# Patient Record
Sex: Female | Born: 1985
Health system: Southern US, Community
[De-identification: ages and names within clinical notes are randomized; demographics above are authoritative.]

## PROBLEM LIST (undated history)

## (undated) DIAGNOSIS — R011 Cardiac murmur, unspecified: Secondary | ICD-10-CM

## (undated) DIAGNOSIS — B019 Varicella without complication: Secondary | ICD-10-CM

## (undated) DIAGNOSIS — G43909 Migraine, unspecified, not intractable, without status migrainosus: Secondary | ICD-10-CM

## (undated) DIAGNOSIS — I10 Essential (primary) hypertension: Secondary | ICD-10-CM

## (undated) DIAGNOSIS — E282 Polycystic ovarian syndrome: Secondary | ICD-10-CM

## (undated) DIAGNOSIS — K219 Gastro-esophageal reflux disease without esophagitis: Secondary | ICD-10-CM

## (undated) DIAGNOSIS — Z87440 Personal history of urinary (tract) infections: Secondary | ICD-10-CM

## (undated) HISTORY — DX: Polycystic ovarian syndrome: E28.2

## (undated) HISTORY — DX: Migraine, unspecified, not intractable, without status migrainosus: G43.909

## (undated) HISTORY — DX: Varicella without complication: B01.9

## (undated) HISTORY — DX: Gastro-esophageal reflux disease without esophagitis: K21.9

## (undated) HISTORY — DX: Personal history of urinary (tract) infections: Z87.440

## (undated) HISTORY — PX: WISDOM TOOTH EXTRACTION: SHX21

## (undated) HISTORY — DX: Cardiac murmur, unspecified: R01.1

---

## 2012-05-27 HISTORY — PX: CHOLECYSTECTOMY OPEN: SUR202

## 2014-11-10 ENCOUNTER — Encounter (INDEPENDENT_AMBULATORY_CARE_PROVIDER_SITE_OTHER): Payer: Self-pay

## 2014-11-10 ENCOUNTER — Ambulatory Visit (INDEPENDENT_AMBULATORY_CARE_PROVIDER_SITE_OTHER): Payer: 59 | Admitting: Primary Care

## 2014-11-10 ENCOUNTER — Encounter: Payer: Self-pay | Admitting: Primary Care

## 2014-11-10 VITALS — BP 130/82 | HR 74 | Temp 98.2°F | Ht 62.0 in | Wt 214.4 lb

## 2014-11-10 DIAGNOSIS — G43909 Migraine, unspecified, not intractable, without status migrainosus: Secondary | ICD-10-CM | POA: Insufficient documentation

## 2014-11-10 DIAGNOSIS — R635 Abnormal weight gain: Secondary | ICD-10-CM | POA: Diagnosis not present

## 2014-11-10 DIAGNOSIS — G43001 Migraine without aura, not intractable, with status migrainosus: Secondary | ICD-10-CM | POA: Diagnosis not present

## 2014-11-10 DIAGNOSIS — E669 Obesity, unspecified: Secondary | ICD-10-CM

## 2014-11-10 LAB — BASIC METABOLIC PANEL
BUN: 8 mg/dL (ref 6–23)
CALCIUM: 9.5 mg/dL (ref 8.4–10.5)
CO2: 26 meq/L (ref 19–32)
CREATININE: 0.77 mg/dL (ref 0.40–1.20)
Chloride: 102 mEq/L (ref 96–112)
GFR: 113.5 mL/min (ref >60.00)
GLUCOSE: 87 mg/dL (ref 70–99)
Potassium: 3.7 mEq/L (ref 3.5–5.1)
Sodium: 135 mEq/L (ref 135–145)

## 2014-11-10 LAB — TSH: TSH: 2.19 u[IU]/mL (ref 0.35–4.50)

## 2014-11-10 NOTE — Assessment & Plan Note (Signed)
Intermittent since childhood. Recently returned over past 6 months. +photophobia and nausea. Relief with sleep and excedrin migraine. Not on preventative meds at this time. Will continue to monitor.

## 2014-11-10 NOTE — Progress Notes (Signed)
Pre visit review using our clinic review tool, if applicable. No additional management support is needed unless otherwise documented below in the visit note. 

## 2014-11-10 NOTE — Progress Notes (Signed)
Subjective:    Patient ID: Veronica Foster, female    DOB: 1985/05/29, 29 y.o.   MRN: [id number]  HPI  Veronica Foster is a 29 year old female who presents today to establish care and discuss the problems mentioned below. Will review old records.  1) Weight gain: Steady weight gain throughout the past 3 years with 35 pounds of weight gain over the past year. She reports daytime tiredness, irregular periods (since 20's), hair growth to face, intermittent palpitations every 2-3 days. Denies enlargement to her anterior neck or difficulty swallowing.  2) Migraines: Present as a child, once managed on medication. No migraines until about 6 months ago and will get them once weekly. She will experience photophobia and nausea. Her migraines will disspate if she's able to fall asleep. Occasionally takes Excedrin migraine with some relief.  Review of Systems  Constitutional:       Steady weight gain over 3 years.  HENT: Negative for rhinorrhea.   Respiratory: Negative for cough and shortness of breath.   Cardiovascular: Negative for chest pain.  Gastrointestinal: Negative for diarrhea and constipation.  Genitourinary: Negative for dysuria and frequency.  Musculoskeletal: Negative for myalgias and arthralgias.  Skin: Negative for rash.  Neurological: Positive for headaches. Negative for dizziness.  Psychiatric/Behavioral:       Denies concerns for anxiety or depression       Past Medical History  Diagnosis Date  . Chicken pox   . GERD (gastroesophageal reflux disease)   . Migraine   . History of UTI     History   Social History  . Marital Status: Single    Spouse Name: N/A  . Number of Children: N/A  . Years of Education: N/A   Occupational History  . Not on file.   Social History Main Topics  . Smoking status: Never Smoker   . Smokeless tobacco: Not on file  . Alcohol Use: 0.0 oz/week    0 Standard drinks or equivalent per week     Comment: soical  . Drug Use: Not on file  .  Sexual Activity: Not on file   Other Topics Concern  . Not on file   Social History Narrative   Single.   Highest level of education is Masters.   Works for [employer] in [employer].   Enjoys traveling, reading, wine tasting.    Past Surgical History  Procedure Laterality Date  . Cholecystectomy open  2014    Family History  Problem Relation Age of Onset  . Cancer Mother     colon  . Stroke Mother   . Hypertension Mother   . Arthritis Paternal Aunt   . Hypertension Maternal Grandmother   . Cancer Maternal Grandfather     prostate  . Hypertension Maternal Grandfather   . Arthritis Paternal Grandmother   . Hypertension Paternal Grandmother   . Cancer Paternal Grandfather     prostate  . Heart disease Paternal Grandfather   . Stroke Paternal Grandfather   . Hypertension Paternal Grandfather     No Known Allergies  No current outpatient prescriptions on file prior to visit.   No current facility-administered medications on file prior to visit.    BP 130/82 mmHg  Pulse 74  Temp(Src) 98.2 F (36.8 C) (Oral)  Ht  (1.575 m)  Wt 214 lb 6.4 oz (97.251 kg)  BMI 39.20 kg/m2  SpO2 96%    Objective:   Physical Exam  Constitutional: She is oriented to person, place, and  time. She appears well-nourished.  HENT:  Right Ear: Tympanic membrane and ear canal normal.  Left Ear: Tympanic membrane and ear canal normal.  Neck: Neck supple. No thyromegaly present.  Cardiovascular: Normal rate and regular rhythm.   Pulmonary/Chest: Effort normal and breath sounds normal.  Neurological: She is alert and oriented to person, place, and time.  Skin: Skin is warm and dry.  Psychiatric: She has a normal mood and affect.          Assessment & Plan:

## 2014-11-10 NOTE — Assessment & Plan Note (Signed)
Steady weight gain over the past 3 years. She endorses healthy lifestyle. Will check TSH and BMP for sugars. Could be PCOS due to irregular periods, hirsutism and weight gain. Will continue to follow.

## 2014-11-10 NOTE — Patient Instructions (Signed)
Complete lab work prior to leaving today. I will notify you of your results.  Please schedule a physical with me in the next 2-3 months. You will also schedule a lab only appointment one week prior. We will discuss your lab results during your physical.  It was a pleasure to meet you today! Please don't hesitate to call me with any questions. Welcome to [facility]!

## 2014-11-11 ENCOUNTER — Telehealth: Payer: Self-pay | Admitting: Primary Care

## 2014-11-11 NOTE — Telephone Encounter (Signed)
Patient returned Chan's call.  Please call her back at work.

## 2014-11-11 NOTE — Telephone Encounter (Signed)
In result note. Called and notified patient of Kate's comments. Patient verbalized understanding.

## 2014-11-22 ENCOUNTER — Other Ambulatory Visit: Payer: Self-pay | Admitting: Primary Care

## 2014-11-22 DIAGNOSIS — Z0000 Encounter for general adult medical examination without abnormal findings: Secondary | ICD-10-CM

## 2014-12-01 ENCOUNTER — Other Ambulatory Visit (INDEPENDENT_AMBULATORY_CARE_PROVIDER_SITE_OTHER): Payer: 59

## 2014-12-01 DIAGNOSIS — Z0000 Encounter for general adult medical examination without abnormal findings: Secondary | ICD-10-CM

## 2014-12-01 LAB — LIPID PANEL
CHOL/HDL RATIO: 3
CHOLESTEROL: 126 mg/dL (ref 0–200)
HDL: 40.7 mg/dL (ref >39.00)
LDL CALC: 76 mg/dL (ref 0–99)
NONHDL: 85.3
Triglycerides: 47 mg/dL (ref 0.0–149.0)
VLDL: 9.4 mg/dL (ref 0.0–40.0)

## 2014-12-01 LAB — CBC
HCT: 37.5 % (ref 36.0–46.0)
Hemoglobin: 12.3 g/dL (ref 12.0–15.0)
MCHC: 32.9 g/dL (ref 30.0–36.0)
MCV: 82.1 fl (ref 78.0–100.0)
Platelets: 414 10*3/uL — ABNORMAL HIGH (ref 150.0–400.0)
RBC: 4.57 Mil/uL (ref 3.87–5.11)
RDW: 15.7 % — ABNORMAL HIGH (ref 11.5–15.5)
WBC: 7.9 10*3/uL (ref 4.0–10.5)

## 2014-12-01 LAB — HEPATIC FUNCTION PANEL
ALT: 46 U/L — ABNORMAL HIGH (ref 0–35)
AST: 31 U/L (ref 0–37)
Albumin: 3.9 g/dL (ref 3.5–5.2)
Alkaline Phosphatase: 65 U/L (ref 39–117)
BILIRUBIN DIRECT: 0.2 mg/dL (ref 0.0–0.3)
BILIRUBIN TOTAL: 0.5 mg/dL (ref 0.2–1.2)
Total Protein: 7.4 g/dL (ref 6.0–8.3)

## 2014-12-01 LAB — HEMOGLOBIN A1C: Hgb A1c MFr Bld: 5.9 % (ref 4.6–6.5)

## 2014-12-08 ENCOUNTER — Ambulatory Visit (INDEPENDENT_AMBULATORY_CARE_PROVIDER_SITE_OTHER): Payer: 59 | Admitting: Primary Care

## 2014-12-08 ENCOUNTER — Encounter: Payer: Self-pay | Admitting: Primary Care

## 2014-12-08 VITALS — BP 122/84 | HR 98 | Temp 98.4°F | Ht 62.0 in | Wt 214.8 lb

## 2014-12-08 DIAGNOSIS — Z0000 Encounter for general adult medical examination without abnormal findings: Secondary | ICD-10-CM | POA: Diagnosis not present

## 2014-12-08 DIAGNOSIS — G43001 Migraine without aura, not intractable, with status migrainosus: Secondary | ICD-10-CM

## 2014-12-08 DIAGNOSIS — R7309 Other abnormal glucose: Secondary | ICD-10-CM | POA: Diagnosis not present

## 2014-12-08 DIAGNOSIS — E669 Obesity, unspecified: Secondary | ICD-10-CM

## 2014-12-08 DIAGNOSIS — R7303 Prediabetes: Secondary | ICD-10-CM

## 2014-12-08 NOTE — Assessment & Plan Note (Signed)
Tetanus and pap up to date. Labs mostly unremarkable except for A1C. Discussed importance of healthy diet and exercise.  Will do work up for PCOS before her move to [place] in 1 month.

## 2014-12-08 NOTE — Patient Instructions (Signed)
Complete your ultrasound in the next week. I will notify you of your results.  It is important that you improve your diet. Please limit carbohydrates in the form of white bread, rice, pasta, cakes, cookies, sugary drinks, etc. Increase your consumption of fresh fruits and vegetables. Be sure to drink plenty of water daily.  It was nice to see you! Have a fun and safe journey!

## 2014-12-08 NOTE — Progress Notes (Signed)
Subjective:    Patient ID: Veronica Foster, female    DOB: 11-20-1985, 29 y.o.   MRN: [id number]  HPI  Veronica Foster is a 29 year old female who presents today for complete physical.  Immunizations: -Tetanus: Completed within 5 years, when applying for school. -Influenza: Received last season.  Diet: Overall fair. She is a vegetarian. Breakfast: Juice, smoothie. Snack: Protein bar. Lunch: Veggie sub, salad. Dinner: Pasta, rice and beans, tofu. Drinking mostly water, flavored packets with water, vitamin water zero.  Exercise: Personal trainer and will go twice weekly for 30 minutes. Also walks outside of gym. Eye exam: Completed 1.5 years ago. Dental exam: Completed one year ago. Pap Smear: Completed 1.5 years ago. Normal.  Wt Readings from Last 3 Encounters:  12/08/14 214 lb 12.8 oz (97.433 kg)  11/10/14 214 lb 6.4 oz (97.251 kg)   1) Borderline diabetes: Evidenced by A1C of 5.9 one week ago. Acanthosis nigricans present to base of anterior neck, family history of PCOS, irregular periods. Denies abdominal or pelvic pain. Diet is fair but consists of carbs as she is a vegetarian. She is moving to [place] and then [place] in 1 month. Denies polyuria, polydipsia, polyphagia. Difficulty losing weight despite work outs with trainer. She's had no work up for PCOS in the past.  Review of Systems  Constitutional: Negative for fatigue and unexpected weight change.  HENT: Negative for rhinorrhea.   Respiratory: Negative for cough and shortness of breath.   Cardiovascular: Negative for chest pain.  Gastrointestinal: Negative for diarrhea and constipation.  Genitourinary: Negative for difficulty urinating.       Irregular periods.  Skin: Negative for rash.       Dry skin to plantar surface of right foot.  Neurological: Negative for dizziness and headaches.  Psychiatric/Behavioral:       Denies concerns for anxiety or depression       Past Medical History  Diagnosis Date  . Chicken pox    . GERD (gastroesophageal reflux disease)   . Migraine   . History of UTI     History   Social History  . Marital Status: Single    Spouse Name: N/A  . Number of Children: N/A  . Years of Education: N/A   Occupational History  . Not on file.   Social History Main Topics  . Smoking status: Never Smoker   . Smokeless tobacco: Not on file  . Alcohol Use: 0.0 oz/week    0 Standard drinks or equivalent per week     Comment: soical  . Drug Use: Not on file  . Sexual Activity: Not on file   Other Topics Concern  . Not on file   Social History Narrative   Single.   Highest level of education is Masters.   Works for [employer] in [employer].   Enjoys traveling, reading, wine tasting.    Past Surgical History  Procedure Laterality Date  . Cholecystectomy open  2014    Family History  Problem Relation Age of Onset  . Cancer Mother     colon  . Stroke Mother   . Hypertension Mother   . Arthritis Paternal Aunt   . Hypertension Maternal Grandmother   . Cancer Maternal Grandfather     prostate  . Hypertension Maternal Grandfather   . Arthritis Paternal Grandmother   . Hypertension Paternal Grandmother   . Cancer Paternal Grandfather     prostate  . Heart disease Paternal Grandfather   . Stroke Paternal Grandfather   .  Hypertension Paternal Grandfather     No Known Allergies  No current outpatient prescriptions on file prior to visit.   No current facility-administered medications on file prior to visit.    BP 122/84 mmHg  Pulse 98  Temp(Src) 98.4 F (36.9 C) (Oral)  Ht  (1.575 m)  Wt 214 lb 12.8 oz (97.433 kg)  BMI 39.28 kg/m2  SpO2 96%    Objective:   Physical Exam  Constitutional: She is oriented to person, place, and time. She appears well-nourished.  HENT:  Right Ear: Tympanic membrane and ear canal normal.  Left Ear: Tympanic membrane and ear canal normal.  Nose: Nose normal.  Mouth/Throat: Oropharynx is clear and moist.  Eyes:  Conjunctivae and EOM are normal. Pupils are equal, round, and reactive to light.  Neck: Neck supple. No thyromegaly present.  Cardiovascular: Normal rate and regular rhythm.   Pulmonary/Chest: Effort normal and breath sounds normal.  Musculoskeletal: Normal range of motion.  Neurological: She is alert and oriented to person, place, and time. She has normal reflexes. No cranial nerve deficit.  Skin: Skin is warm and dry.  Acanthosis nigricans present to anterior base of neck.  Psychiatric: She has a normal mood and affect.          Assessment & Plan:

## 2014-12-08 NOTE — Progress Notes (Signed)
Pre visit review using our clinic review tool, if applicable. No additional management support is needed unless otherwise documented below in the visit note. 

## 2014-12-08 NOTE — Assessment & Plan Note (Signed)
Denies headaches recently.

## 2014-12-08 NOTE — Assessment & Plan Note (Signed)
Working with Systems analystpersonal trainer twice weekly for 30 min. Fair diet, although given A1C she will need to cut out pasta and rice. We discussed this today and she verbalized understanding.

## 2014-12-08 NOTE — Assessment & Plan Note (Signed)
A1C of 5.9. Presence of acanthosis nigricans to anterior base of neck. Suspect probably PCOS due to irregular periods, obesity, and family history. Will obtain ultrasound of ovaries for further evaluation. She will be moving to [place] in one month and then will be in [place]. Strongly advised patient to establish care when she moves.  If [place] confirms PCOS will start birth control and recommend she be started on Metformin in [place].

## 2014-12-09 ENCOUNTER — Encounter: Payer: 59 | Admitting: Primary Care

## 2014-12-15 ENCOUNTER — Ambulatory Visit
Admission: RE | Admit: 2014-12-15 | Discharge: 2014-12-15 | Disposition: A | Discharge status: Home / Self Care | Payer: 59 | Source: Ambulatory Visit | Attending: Primary Care | Admitting: Primary Care

## 2014-12-15 DIAGNOSIS — R7303 Prediabetes: Secondary | ICD-10-CM

## 2014-12-16 ENCOUNTER — Telehealth: Payer: Self-pay | Admitting: Primary Care

## 2014-12-16 NOTE — Telephone Encounter (Signed)
Pt called returning Chan's phone call. She said you can reach her at work at [phone].

## 2014-12-16 NOTE — Telephone Encounter (Signed)
Spoke with patient and advised results, pt scheduled appt 12/30/14

## 2014-12-30 ENCOUNTER — Encounter: Payer: Self-pay | Admitting: Primary Care

## 2014-12-30 ENCOUNTER — Ambulatory Visit (INDEPENDENT_AMBULATORY_CARE_PROVIDER_SITE_OTHER): Payer: 59 | Admitting: Primary Care

## 2014-12-30 VITALS — BP 118/82 | HR 88 | Temp 98.3°F | Ht 62.0 in | Wt 213.0 lb

## 2014-12-30 DIAGNOSIS — E669 Obesity, unspecified: Secondary | ICD-10-CM

## 2014-12-30 DIAGNOSIS — E282 Polycystic ovarian syndrome: Secondary | ICD-10-CM

## 2014-12-30 DIAGNOSIS — Z309 Encounter for contraceptive management, unspecified: Secondary | ICD-10-CM

## 2014-12-30 DIAGNOSIS — R7309 Other abnormal glucose: Secondary | ICD-10-CM

## 2014-12-30 DIAGNOSIS — Z30011 Encounter for initial prescription of contraceptive pills: Secondary | ICD-10-CM

## 2014-12-30 DIAGNOSIS — R7303 Prediabetes: Secondary | ICD-10-CM

## 2014-12-30 HISTORY — DX: Polycystic ovarian syndrome: E28.2

## 2014-12-30 MED ORDER — NORETHINDRONE ACET-ETHINYL EST 1-20 MG-MCG PO TABS: 1.0000 | ORAL_TABLET | Freq: Every day | ORAL | Status: DC

## 2014-12-30 NOTE — Assessment & Plan Note (Signed)
A1C of 5.9. Confirmation on vaginal ultrasound. Urine pregnancy negative. Initiated oral contraceptive pills today with instructions of when to start. LMP over 1 year ago, she does not smoke and has no history of blood clotting. Follow up in October for repeat A1C and diet.

## 2014-12-30 NOTE — Patient Instructions (Signed)
Start birth control pills for prevention of additional ovarian cysts. Start this Sunday and take the pill once daily at the same time everyday.  Follow up after October 7th for re-evaluation of A1C.  It was nice to see you!

## 2014-12-30 NOTE — Progress Notes (Signed)
   Subjective:    Patient ID: Veronica Foster, female    DOB: 09/07/1985, 29 y.o.   MRN: [id number]  HPI  Veronica Foster is a 29 year old female who presents today for follow up. She presented for her physical on 12/08/14 and had a borderline A1C of 5.9 with the presence of acanthosis nigricans. She was sent for vaginal ultrasound and was found to have PCOS. She denies pelvic discomfort or heavy bleeding. Her LMP was over one year ago. She has no history of blood clots and does not smoke.  Diet: She's reduced her consumption of carbohydrates (pasta), eating more fruits and salads, cutting out her consumption of juice and drinking mostly water. Exercise: She is working with a Psychologist, educational and is adding in extra exercise days  Review of Systems  Respiratory: Negative for shortness of breath.   Cardiovascular: Negative for chest pain.  Genitourinary:       Irregular periods       Past Medical History  Diagnosis Date  . Chicken pox   . GERD (gastroesophageal reflux disease)   . Migraine   . History of UTI     History   Social History  . Marital Status: Single    Spouse Name: N/A  . Number of Children: N/A  . Years of Education: N/A   Occupational History  . Not on file.   Social History Main Topics  . Smoking status: Never Smoker   . Smokeless tobacco: Not on file  . Alcohol Use: 0.0 oz/week    0 Standard drinks or equivalent per week     Comment: soical  . Drug Use: Not on file  . Sexual Activity: Not on file   Other Topics Concern  . Not on file   Social History Narrative   Single.   Highest level of education is Masters.   Works for [employer] in [employer].   Enjoys traveling, reading, wine tasting.    Past Surgical History  Procedure Laterality Date  . Cholecystectomy open  2014    Family History  Problem Relation Age of Onset  . Cancer Mother     colon  . Stroke Mother   . Hypertension Mother   . Arthritis Paternal Aunt   . Hypertension Maternal  Grandmother   . Cancer Maternal Grandfather     prostate  . Hypertension Maternal Grandfather   . Arthritis Paternal Grandmother   . Hypertension Paternal Grandmother   . Cancer Paternal Grandfather     prostate  . Heart disease Paternal Grandfather   . Stroke Paternal Grandfather   . Hypertension Paternal Grandfather     No Known Allergies  No current outpatient prescriptions on file prior to visit.   No current facility-administered medications on file prior to visit.    BP 118/82 mmHg  Pulse 88  Temp(Src) 98.3 F (36.8 C) (Oral)  Ht  (1.575 m)  Wt 213 lb (96.616 kg)  BMI 38.95 kg/m2  SpO2 97%    Objective:   Physical Exam  Constitutional: She appears well-nourished.  Cardiovascular: Normal rate and regular rhythm.   Pulmonary/Chest: Effort normal and breath sounds normal.  Skin: Skin is warm and dry.          Assessment & Plan:

## 2014-12-30 NOTE — Assessment & Plan Note (Signed)
Overall improvement. 1 pound weight loss since last visit.  Commended her on this success and encouraged her to keep going.  Wt Readings from Last 3 Encounters:  12/30/14 213 lb (96.616 kg)  12/08/14 214 lb 12.8 oz (97.433 kg)  11/10/14 214 lb 6.4 oz (97.251 kg)

## 2014-12-30 NOTE — Assessment & Plan Note (Signed)
She is not moving to [place] anymore. Repeat A1C in October.

## 2014-12-30 NOTE — Progress Notes (Signed)
Pre visit review using our clinic review tool, if applicable. No additional management support is needed unless otherwise documented below in the visit note. 

## 2015-03-10 ENCOUNTER — Ambulatory Visit: Payer: 59 | Admitting: Primary Care

## 2015-06-06 ENCOUNTER — Ambulatory Visit (INDEPENDENT_AMBULATORY_CARE_PROVIDER_SITE_OTHER): Payer: 59 | Admitting: Primary Care

## 2015-06-06 VITALS — BP 124/84 | HR 101 | Temp 98.7°F | Ht 62.0 in | Wt 208.8 lb

## 2015-06-06 DIAGNOSIS — R059 Cough, unspecified: Secondary | ICD-10-CM

## 2015-06-06 DIAGNOSIS — R05 Cough: Secondary | ICD-10-CM

## 2015-06-06 MED ORDER — HYDROCODONE-HOMATROPINE 5-1.5 MG/5ML PO SYRP
5.0000 mL | ORAL_SOLUTION | Freq: Every evening | ORAL | Status: DC | PRN
Start: 2015-06-06 — End: 2015-10-13

## 2015-06-06 MED ORDER — BENZONATATE 200 MG PO CAPS: 200.0000 mg | ORAL_CAPSULE | Freq: Three times a day (TID) | ORAL | Status: DC | PRN

## 2015-06-06 MED FILL — BENZONATATE 200 MG CAPSULE: 200 | 7 days supply | Qty: 21 | Fill #0

## 2015-06-06 MED FILL — NORETHIND-ETH ESTRAD 1-0.02: 1-20 | 63 days supply | Qty: 63 | Fill #2

## 2015-06-06 MED FILL — HYDROCODONE-HOMATROPINE SYR: 5-1.5 | 10 days supply | Qty: 50 | Fill #0

## 2015-06-06 NOTE — Patient Instructions (Signed)
You may take Benzonatate capsules for cough. Take 1 capsule by mouth three times daily as needed for cough.  You may take the Hycodan cough suppressant at bedtime as needed for cough and rest. Caution this medication contains codeine and will make you feel drowsy.  Please call me in 3-4 days if no improvement in cough.  Increase consumption of fluids and rest.  It was a pleasure to see you today!  Upper Respiratory Infection, Adult Most upper respiratory infections (URIs) are a viral infection of the air passages leading to the lungs. A URI affects the nose, throat, and upper air passages. The most common type of URI is nasopharyngitis and is typically referred to as "the common cold." URIs run their course and usually go away on their own. Most of the time, a URI does not require medical attention, but sometimes a bacterial infection in the upper airways can follow a viral infection. This is called a secondary infection. Sinus and middle ear infections are common types of secondary upper respiratory infections. Bacterial pneumonia can also complicate a URI. A URI can worsen asthma and chronic obstructive pulmonary disease (COPD). Sometimes, these complications can require emergency medical care and may be life threatening.  CAUSES Almost all URIs are caused by viruses. A virus is a type of germ and can spread from one person to another.  RISKS FACTORS You may be at risk for a URI if:   You smoke.   You have chronic heart or lung disease.  You have a weakened defense (immune) system.   You are very young or very old.   You have nasal allergies or asthma.  You work in crowded or poorly ventilated areas.  You work in health care facilities or schools. SIGNS AND SYMPTOMS  Symptoms typically develop 2-3 days after you come in contact with a cold virus. Most viral URIs last 7-10 days. However, viral URIs from the influenza virus (flu virus) can last 14-18 days and are typically more  severe. Symptoms may include:   Runny or stuffy (congested) nose.   Sneezing.   Cough.   Sore throat.   Headache.   Fatigue.   Fever.   Loss of appetite.   Pain in your forehead, behind your eyes, and over your cheekbones (sinus pain).  Muscle aches.  DIAGNOSIS  Your health care provider may diagnose a URI by:  Physical exam.  Tests to check that your symptoms are not due to another condition such as:  Strep throat.  Sinusitis.  Pneumonia.  Asthma. TREATMENT  A URI goes away on its own with time. It cannot be cured with medicines, but medicines may be prescribed or recommended to relieve symptoms. Medicines may help:  Reduce your fever.  Reduce your cough.  Relieve nasal congestion. HOME CARE INSTRUCTIONS   Take medicines only as directed by your health care provider.   Gargle warm saltwater or take cough drops to comfort your throat as directed by your health care provider.  Use a warm mist humidifier or inhale steam from a shower to increase air moisture. This may make it easier to breathe.  Drink enough fluid to keep your urine clear or pale yellow.   Eat soups and other clear broths and maintain good nutrition.   Rest as needed.   Return to work when your temperature has returned to normal or as your health care provider advises. You may need to stay home longer to avoid infecting others. You can also use a face  mask and careful hand washing to prevent spread of the virus.  Increase the usage of your inhaler if you have asthma.   Do not use any tobacco products, including cigarettes, chewing tobacco, or electronic cigarettes. If you need help quitting, ask your health care provider. PREVENTION  The best way to protect yourself from getting a cold is to practice good hygiene.   Avoid oral or hand contact with people with cold symptoms.   Wash your hands often if contact occurs.  There is no clear evidence that vitamin C, vitamin  E, echinacea, or exercise reduces the chance of developing a cold. However, it is always recommended to get plenty of rest, exercise, and practice good nutrition.  SEEK MEDICAL CARE IF:   You are getting worse rather than better.   Your symptoms are not controlled by medicine.   You have chills.  You have worsening shortness of breath.  You have brown or red mucus.  You have yellow or brown nasal discharge.  You have pain in your face, especially when you bend forward.  You have a fever.  You have swollen neck glands.  You have pain while swallowing.  You have white areas in the back of your throat. SEEK IMMEDIATE MEDICAL CARE IF:   You have severe or persistent:  Headache.  Ear pain.  Sinus pain.  Chest pain.  You have chronic lung disease and any of the following:  Wheezing.  Prolonged cough.  Coughing up blood.  A change in your usual mucus.  You have a stiff neck.  You have changes in your:  Vision.  Hearing.  Thinking.  Mood. MAKE SURE YOU:   Understand these instructions.  Will watch your condition.  Will get help right away if you are not doing well or get worse.   This information is not intended to replace advice given to you by your health care provider. Make sure you discuss any questions you have with your health care provider.   Document Released: 11/06/2000 Document Revised: 09/27/2014 Document Reviewed: 08/18/2013 Elsevier Interactive Patient Education [employer]

## 2015-06-06 NOTE — Progress Notes (Signed)
Subjective:    Patient ID: Veronica Foster, female    DOB: Feb 23, 1986, 30 y.o.   MRN: [id number]  HPI  Veronica Foster is a 30 year old female who presents today with a chief complaint of cough. Her cough has been present for the past 4 days. She also reports congestion with clear sputum, sore throat for the past week. Most of her symptoms have dissipated and she's feeling much improved except for her cough. Her cough is worse at night. Denies fevers, chills, fatigue. She's taken cephacol, thera flu, mucinex without improvement in cough.  Review of Systems  Constitutional: Negative for fever, chills and fatigue.  HENT: Positive for congestion. Negative for ear pain, postnasal drip, sinus pressure and sore throat.   Respiratory: Positive for cough. Negative for shortness of breath.   Cardiovascular: Negative for chest pain.  Gastrointestinal: Negative for nausea.  Musculoskeletal: Negative for myalgias.       Past Medical History  Diagnosis Date  . Chicken pox   . GERD (gastroesophageal reflux disease)   . Migraine   . History of UTI     Social History   Social History  . Marital Status: Single    Spouse Name: N/A  . Number of Children: N/A  . Years of Education: N/A   Occupational History  . Not on file.   Social History Main Topics  . Smoking status: Never Smoker   . Smokeless tobacco: Not on file  . Alcohol Use: 0.0 oz/week    0 Standard drinks or equivalent per week     Comment: soical  . Drug Use: Not on file  . Sexual Activity: Not on file   Other Topics Concern  . Not on file   Social History Narrative   Single.   Highest level of education is Masters.   Works for [employer] in [employer].   Enjoys traveling, reading, wine tasting.    Past Surgical History  Procedure Laterality Date  . Cholecystectomy open  2014    Family History  Problem Relation Age of Onset  . Cancer Mother     colon  . Stroke Mother   . Hypertension Mother   . Arthritis  Paternal Aunt   . Hypertension Maternal Grandmother   . Cancer Maternal Grandfather     prostate  . Hypertension Maternal Grandfather   . Arthritis Paternal Grandmother   . Hypertension Paternal Grandmother   . Cancer Paternal Grandfather     prostate  . Heart disease Paternal Grandfather   . Stroke Paternal Grandfather   . Hypertension Paternal Grandfather     No Known Allergies  Current Outpatient Prescriptions on File Prior to Visit  Medication Sig Dispense Refill  . norethindrone-ethinyl estradiol (JUNEL 1/20) 1-20 MG-MCG tablet Take 1 tablet by mouth daily. 3 Package 3   No current facility-administered medications on file prior to visit.    BP 124/84 mmHg  Pulse 101  Temp(Src) 98.7 F (37.1 C) (Oral)  Ht 5\' 2"  (1.575 m)  Wt 208 lb 12.8 oz (94.711 kg)  BMI 38.18 kg/m2  SpO2 98%    Objective:   Physical Exam  Constitutional: She appears well-nourished.  HENT:  Right Ear: Tympanic membrane and ear canal normal.  Left Ear: Tympanic membrane and ear canal normal.  Nose: Nose normal. Right sinus exhibits no maxillary sinus tenderness and no frontal sinus tenderness. Left sinus exhibits no maxillary sinus tenderness and no frontal sinus tenderness.  Mouth/Throat: Oropharynx is clear and moist.  Eyes:  Conjunctivae are normal.  Neck: Neck supple.  Cardiovascular: Normal rate and regular rhythm.   Pulmonary/Chest: Effort normal and breath sounds normal. She has no wheezes. She has no rales.  Lymphadenopathy:    She has no cervical adenopathy.  Skin: Skin is warm and dry.          Assessment & Plan:  Post Viral Cough:  URI symptoms x 1 week.  Overall most symptoms have dissipated except for cough. Exam with clear lungs and normal HENT. Suspect post viral cough syndrome and will treat with supportive measures. Tessalon pearls for day cough, hycodan for HS PRN. She is to call if no improvement in cough in 5 days.

## 2015-06-06 NOTE — Progress Notes (Signed)
Pre visit review using our clinic review tool, if applicable. No additional management support is needed unless otherwise documented below in the visit note. 

## 2015-08-16 MED FILL — LARIN 21 1-20 TABLET: 1-20 | 63 days supply | Qty: 63 | Fill #3

## 2015-10-13 ENCOUNTER — Encounter: Payer: Self-pay | Admitting: Primary Care

## 2015-10-13 ENCOUNTER — Ambulatory Visit: Payer: Self-pay | Admitting: Primary Care

## 2015-10-13 ENCOUNTER — Ambulatory Visit (INDEPENDENT_AMBULATORY_CARE_PROVIDER_SITE_OTHER): Payer: 59 | Admitting: Primary Care

## 2015-10-13 VITALS — BP 124/84 | HR 98 | Temp 99.4°F | Ht 62.0 in | Wt 204.8 lb

## 2015-10-13 DIAGNOSIS — E282 Polycystic ovarian syndrome: Secondary | ICD-10-CM

## 2015-10-13 DIAGNOSIS — R7303 Prediabetes: Secondary | ICD-10-CM | POA: Diagnosis not present

## 2015-10-13 LAB — HEMOGLOBIN A1C: Hgb A1c MFr Bld: 6.1 % (ref 4.6–6.5)

## 2015-10-13 MED ORDER — NORETHINDRONE ACET-ETHINYL EST 1-20 MG-MCG PO TABS: 1.0000 | ORAL_TABLET | Freq: Every day | ORAL | Status: DC

## 2015-10-13 NOTE — Assessment & Plan Note (Signed)
Periods regular now and are every 28 days since initiation of birth control. Refill provided.

## 2015-10-13 NOTE — Patient Instructions (Signed)
Complete lab work prior to leaving today. I will notify you of your results once received.   It's important to work on improving your diet.  Reduce consumption of: Processed carbohydrates, pastas, rice, macaroni and cheese. Limit consumption of juice and sodas.  Increase: Vegetables, salads, whole grains, fruit, water.  Start exercising. You should be getting 1 hour of moderate intensity exercise 5 days weekly.  I sent refills of your birth control to your pharmacy.  It was a pleasure to see you today!

## 2015-10-13 NOTE — Assessment & Plan Note (Addendum)
A1C of 5.9 in July 2016. Commended her on her diet changes, encouraged regular exercise. Information provided regarding further changes in her diet. Brother recent diagnosed with diabetes at age 31. Presence of acanthosis nigricans. A1C due and pending now.

## 2015-10-13 NOTE — Progress Notes (Signed)
Subjective:    Patient ID: Veronica Foster, female    DOB: 02/06/1986, 30 y.o.   MRN: [id number]  HPI  Veronica Foster is a 30 year old female who presents today for follow up of prediabets and for a refill of her birth control. She was evaluated July 2016 and noted to have an A1C of 5.9. She was encouraged to improve her diet and start exercising.  Her brother was just diagnosed with diabetes at age 918. Denies chest pain, numbness/tinling, changes in vision, dizziness.   She's worked to improve her diet. She is a vegetarian and is working to reduce pastas and junk food. Her diet currently consists of: Breakfast: Eggs, juice, toast, fruit Lunch: Vegetables, mac and cheese, sweet potatoes, salad bar Dinner: Vegetables, potatoes, caesar salads Snacks: Occasionally on granola bars, popcorn bags. Beverages: Soda (1 daily), water  Exercise: She is currently not exercising.  Review of Systems  Respiratory: Negative for shortness of breath.   Cardiovascular: Negative for chest pain.  Endocrine: Negative for polydipsia, polyphagia and polyuria.  Neurological: Negative for dizziness and numbness.       Past Medical History  Diagnosis Date  . Chicken pox   . GERD (gastroesophageal reflux disease)   . Migraine   . History of UTI      Social History   Social History  . Marital Status: Single    Spouse Name: N/A  . Number of Children: N/A  . Years of Education: N/A   Occupational History  . Not on file.   Social History Main Topics  . Smoking status: Never Smoker   . Smokeless tobacco: Not on file  . Alcohol Use: 0.0 oz/week    0 Standard drinks or equivalent per week     Comment: soical  . Drug Use: Not on file  . Sexual Activity: Not on file   Other Topics Concern  . Not on file   Social History Narrative   Single.   Highest level of education is Masters.   Works for [employer] in [employer].   Enjoys traveling, reading, wine tasting.    Past Surgical History    Procedure Laterality Date  . Cholecystectomy open  2014    Family History  Problem Relation Age of Onset  . Cancer Mother     colon  . Stroke Mother   . Hypertension Mother   . Arthritis Paternal Aunt   . Hypertension Maternal Grandmother   . Cancer Maternal Grandfather     prostate  . Hypertension Maternal Grandfather   . Arthritis Paternal Grandmother   . Hypertension Paternal Grandmother   . Cancer Paternal Grandfather     prostate  . Heart disease Paternal Grandfather   . Stroke Paternal Grandfather   . Hypertension Paternal Grandfather     No Known Allergies  No current outpatient prescriptions on file prior to visit.   No current facility-administered medications on file prior to visit.    BP 124/84 mmHg  Pulse 98  Temp(Src) 99.4 F (37.4 C) (Oral)  Ht 5\' 2"  (1.575 m)  Wt 204 lb 12.8 oz (92.897 kg)  BMI 37.45 kg/m2  SpO2 98%  LMP 09/17/2015    Objective:   Physical Exam  Constitutional: She appears well-nourished.  Cardiovascular: Normal rate and regular rhythm.   Pulmonary/Chest: Effort normal and breath sounds normal.  Skin: Skin is warm and dry.  Presence of acanthosis nigricans to neckline.           Assessment &  Plan:

## 2015-10-16 ENCOUNTER — Ambulatory Visit: Payer: 59 | Admitting: Primary Care

## 2015-11-03 MED FILL — LARIN 21 1-20 TABLET: 1-20 | 84 days supply | Qty: 63 | Fill #0

## 2016-01-19 MED FILL — LARIN 21 1-20 TABLET: 1-20 | 84 days supply | Qty: 63 | Fill #1

## 2016-02-06 IMAGING — US US TRANSVAGINAL NON-OB
1 series · 13 of 25 positions shown · non-contrast
Comparison: None

CLINICAL DATA: Oligomenorrhea and borderline diabetes. Morbid
obesity. Clinical suspicion for polycystic ovarian disease.

EXAM:
TRANSABDOMINAL AND TRANSVAGINAL ULTRASOUND OF PELVIS
TECHNIQUE: Both transabdominal and transvaginal ultrasound examinations of the
pelvis were performed. Transabdominal technique was performed for
global imaging of the pelvis including uterus, ovaries, adnexal
regions, and pelvic cul-de-sac. It was necessary to proceed with
endovaginal exam following the transabdominal exam to visualize the
endometrial stripe and ovaries.

[Series 1: us transvaginal non-ob · 0.25mm/px · 13 of 61 slices shown]
[im 1/61]
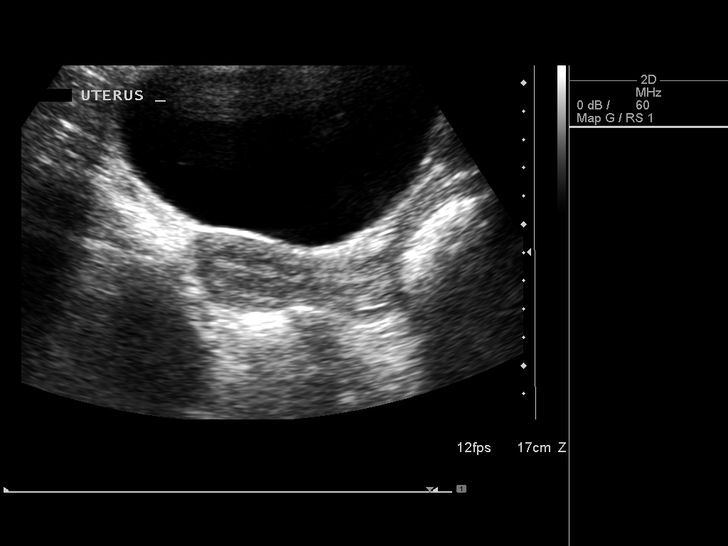
[im 6/61]
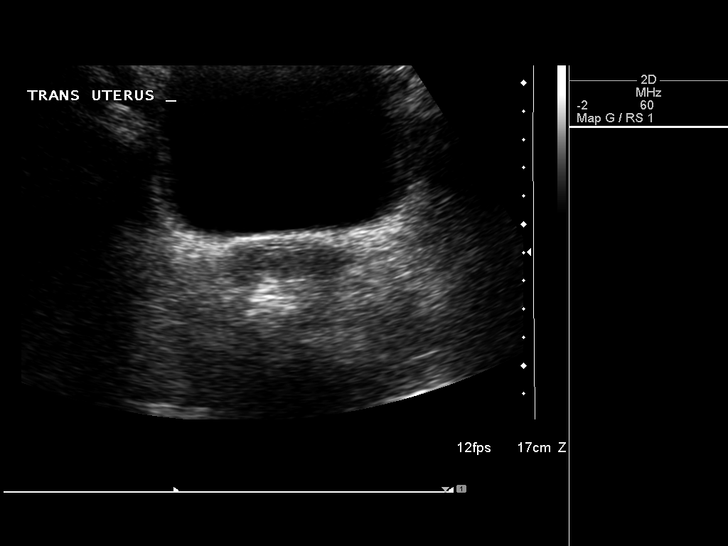
[im 11/61]
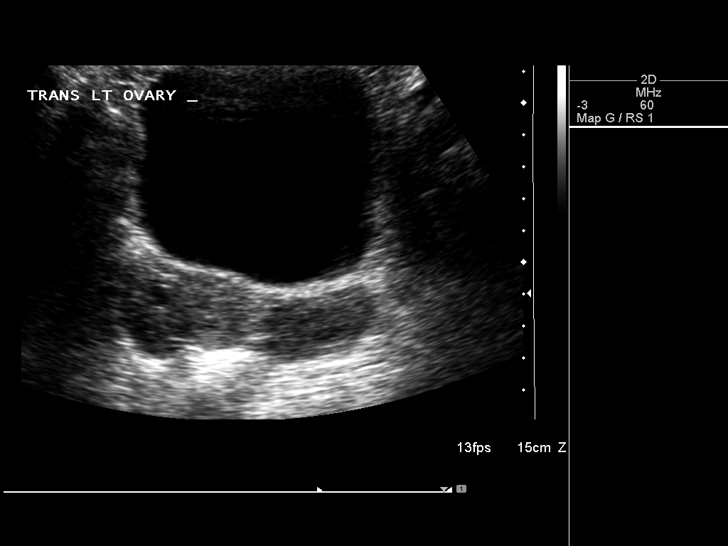
[im 16/61]
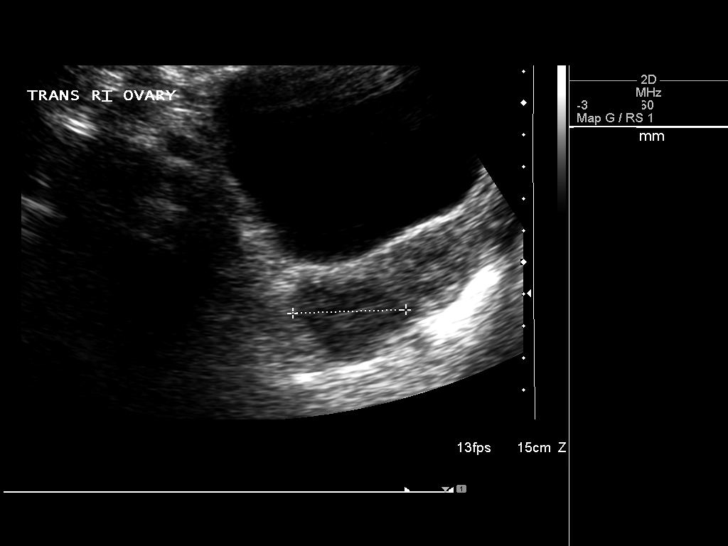
[im 21/61]
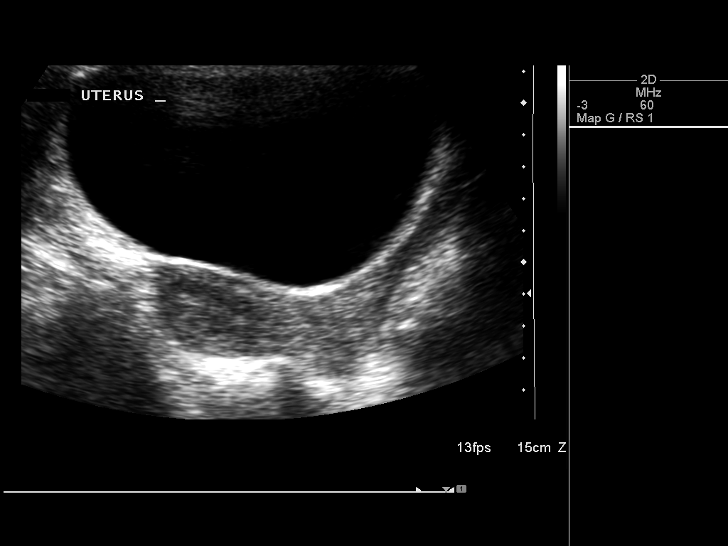
[im 26/61]
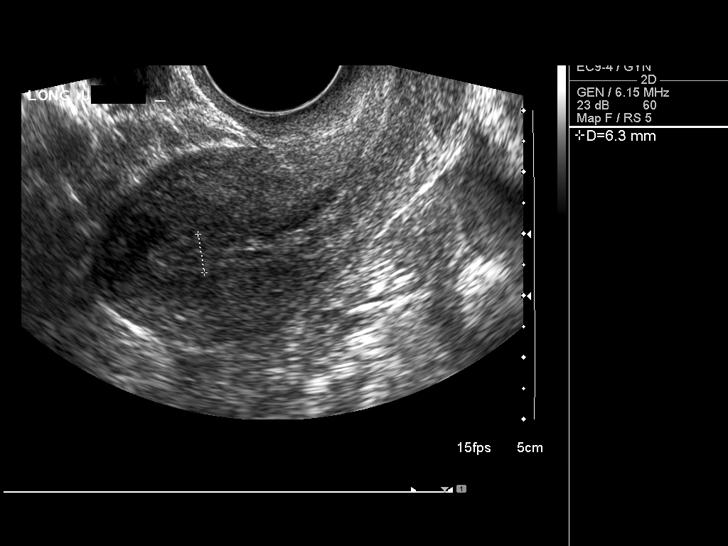
[im 31/61]
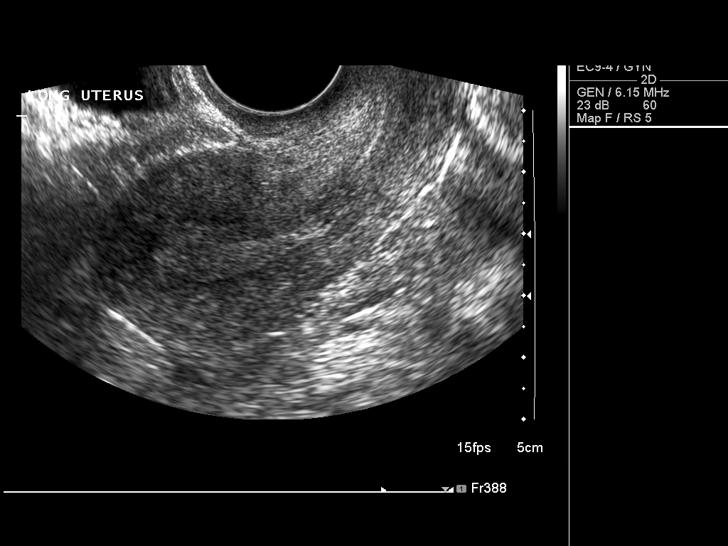
[im 36/61]
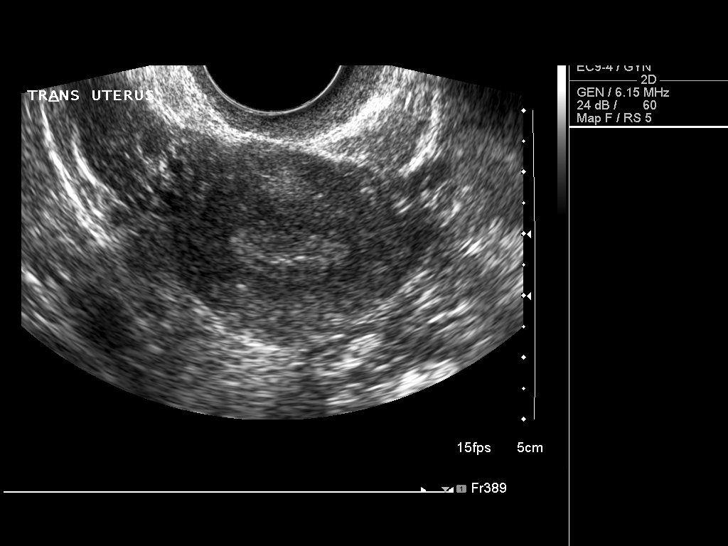
[im 41/61]
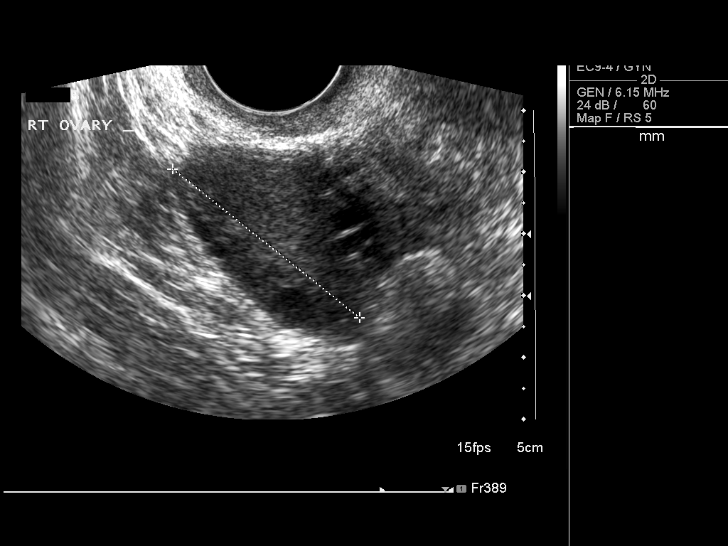
[im 46/61]
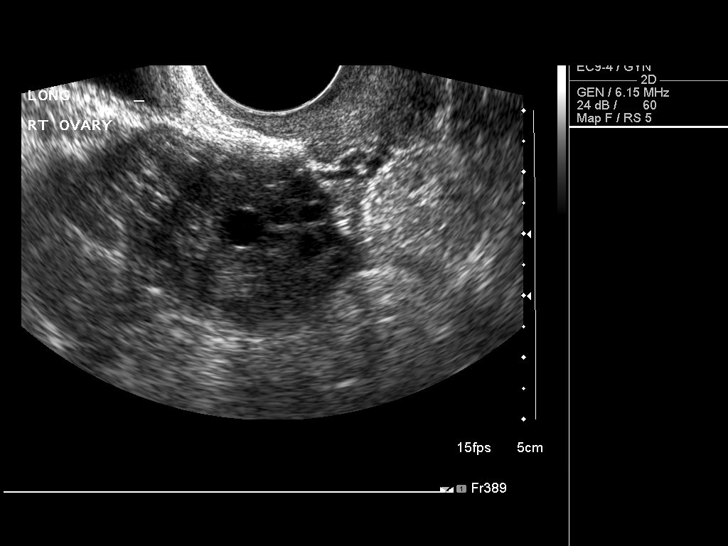
[im 51/61]
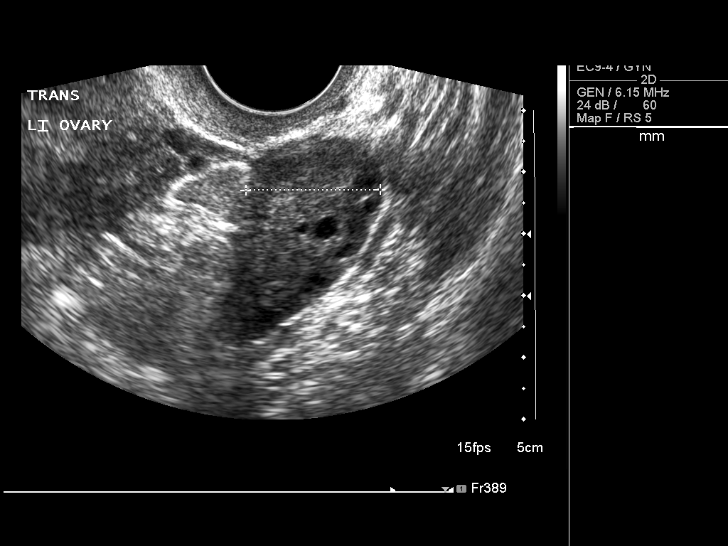
[im 56/61]
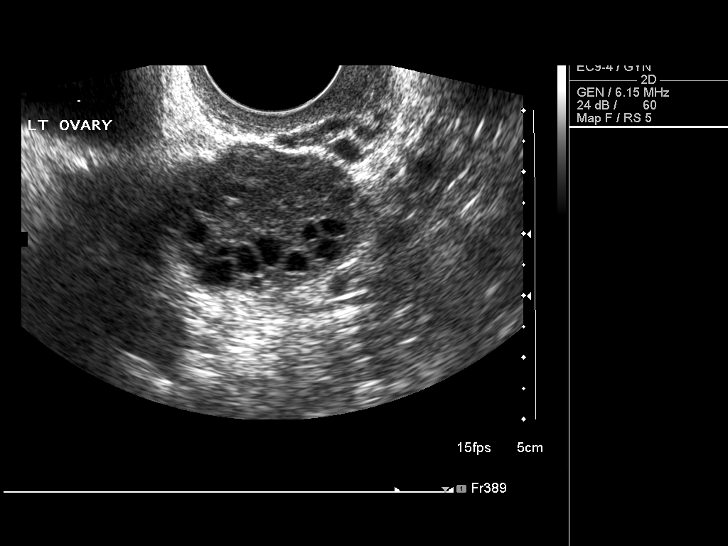
[im 61/61]
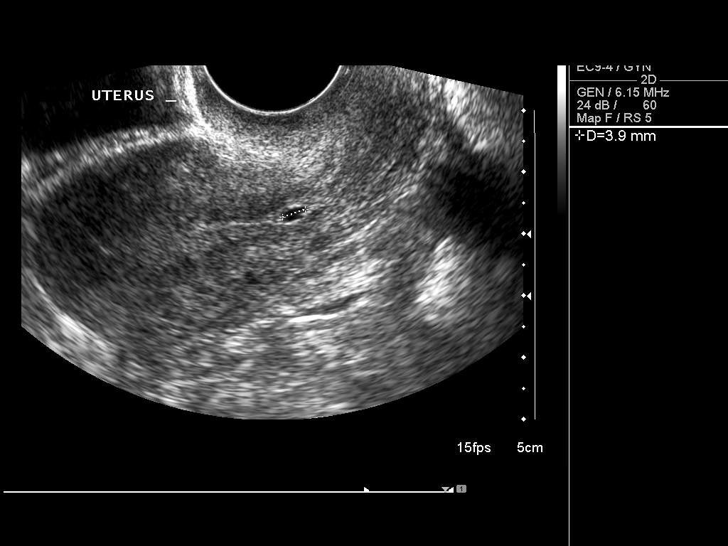

[13 of 25 positions shown; findings below may reference images not displayed]

FINDINGS: Uterus

Measurements: 7.6 x 2.7 x 4.5 cm. No fibroids or other mass
visualized.

Endometrium

Thickness: 6 mm.  No focal abnormality visualized.

Right ovary

Measurements: 3.4 x 2.8 x 3.0 cm. No evidence of ovarian mass.
Numerous small, less than 1cm, follicles, without evidence of
dominant follicle or corpus luteum.

Left ovary

Measurements: 3.2 x 2.9 x 2.2 cm. No evidence of ovarian mass.
Numerous small, less than 1cm, follicles, without evidence of
dominant follicle or corpus luteum.

Other findings

No free fluid.
IMPRESSION: Numerous small bilateral ovarian follicles, without evidence of
dominant follicle or corpus luteum. These findings meet sonographic
criteria for polycystic ovary syndrome; recommend correlation for
clinical signs/symptoms of PCOS, and consider biochemical testing if
warranted.

Normal appearance of uterus.

## 2016-03-29 ENCOUNTER — Encounter: Payer: Self-pay | Admitting: Primary Care

## 2016-03-29 ENCOUNTER — Ambulatory Visit (INDEPENDENT_AMBULATORY_CARE_PROVIDER_SITE_OTHER): Payer: 59 | Admitting: Primary Care

## 2016-03-29 VITALS — BP 152/94 | HR 93 | Temp 99.4°F | Ht 62.0 in | Wt 202.4 lb

## 2016-03-29 DIAGNOSIS — R5383 Other fatigue: Secondary | ICD-10-CM | POA: Diagnosis not present

## 2016-03-29 LAB — CBC WITH DIFFERENTIAL/PLATELET
BASOS ABS: 0 {cells}/uL (ref 0–200)
Basophils Relative: 0 %
EOS PCT: 2 %
Eosinophils Absolute: 200 cells/uL (ref 15–500)
HCT: 32.5 % — ABNORMAL LOW (ref 35.0–45.0)
HEMOGLOBIN: 9.9 g/dL — AB (ref 11.7–15.5)
LYMPHS ABS: 4500 {cells}/uL — AB (ref 850–3900)
Lymphocytes Relative: 45 %
MCH: 23.6 pg — AB (ref 27.0–33.0)
MCHC: 30.5 g/dL — AB (ref 32.0–36.0)
MCV: 77.6 fL — AB (ref 80.0–100.0)
MPV: 9.4 fL (ref 7.5–12.5)
Monocytes Absolute: 900 cells/uL (ref 200–950)
Monocytes Relative: 9 %
NEUTROS PCT: 44 %
Neutro Abs: 4400 cells/uL (ref 1500–7800)
Platelets: 532 10*3/uL — ABNORMAL HIGH (ref 140–400)
RBC: 4.19 MIL/uL (ref 3.80–5.10)
RDW: 16 % — ABNORMAL HIGH (ref 11.0–15.0)
WBC: 10 10*3/uL (ref 3.8–10.8)

## 2016-03-29 LAB — TSH: TSH: 3.41 m[IU]/L

## 2016-03-29 LAB — HEMOGLOBIN A1C
Hgb A1c MFr Bld: 5.5 % (ref <5.7)
MEAN PLASMA GLUCOSE: 111 mg/dL

## 2016-03-29 NOTE — Patient Instructions (Addendum)
Complete lab work prior to leaving today.   Your symptoms could be related to stress. Work to decrease stress through exercise, spending time on yourself, etc.   Ensure you are consuming 64 ounces of water daily.  I will be in touch Monday with your results.  It was a pleasure to see you today!

## 2016-03-29 NOTE — Progress Notes (Signed)
Pre visit review using our clinic review tool, if applicable. No additional management support is needed unless otherwise documented below in the visit note. 

## 2016-03-29 NOTE — Progress Notes (Signed)
Subjective:    Patient ID: Veronica CommonsKrista Foster, female    DOB: 12/04/1985, 30 y.o.   MRN: [id number]  HPI  Veronica Foster is a 30 year old female who presents today with a chief complaint of fatigue.  Symptoms of nausea and headaches that began 2 weeks ago. She's felt foggy with intermittent abdominal cramping, decreased appetite, fatigue. The cramping was located to the left groin and also to the LUQ which dissipated Sunday after taking ibuprofen. She's not had abdominal pain since. Her headaches are located to the right temporal lobe with throbbing sensation.   She denies fevers, chills, vomiting, photophobia, diarrhea, constipation, shortness of breath. She's been under increased stress with work and school over the past 2 weeks.   Review of Systems  Constitutional: Positive for fatigue. Negative for chills and fever.  HENT: Negative for congestion.   Respiratory: Negative for cough and shortness of breath.   Cardiovascular: Negative for chest pain.  Gastrointestinal: Positive for abdominal pain and nausea. Negative for constipation, diarrhea and vomiting.  Endocrine: Negative for polydipsia.  Genitourinary: Negative for dysuria, frequency and vaginal discharge.  Neurological: Positive for headaches. Negative for dizziness.       Past Medical History:  Diagnosis Date  . Chicken pox   . GERD (gastroesophageal reflux disease)   . History of UTI   . Migraine   . Murmur      Social History   Social History  . Marital status: Single    Spouse name: N/A  . Number of children: N/A  . Years of education: N/A   Occupational History  . Not on file.   Social History Main Topics  . Smoking status: Never Smoker  . Smokeless tobacco: Not on file  . Alcohol use 0.0 oz/week     Comment: soical  . Drug use: Unknown  . Sexual activity: Not on file   Other Topics Concern  . Not on file   Social History Narrative   Single.   Highest level of education is Masters.   Works [employer] in  [employer].   Enjoys traveling, reading, wine tasting.    Past Surgical History:  Procedure Laterality Date  . CHOLECYSTECTOMY OPEN  2014    Family History  Problem Relation Age of Onset  . Cancer Mother     colon  . Stroke Mother   . Hypertension Mother   . Arthritis Paternal Aunt   . Hypertension Maternal Grandmother   . Cancer Maternal Grandfather     prostate  . Hypertension Maternal Grandfather   . Arthritis Paternal Grandmother   . Hypertension Paternal Grandmother   . Cancer Paternal Grandfather     prostate  . Heart disease Paternal Grandfather   . Stroke Paternal Grandfather   . Hypertension Paternal Grandfather     No Known Allergies  Current Outpatient Prescriptions on File Prior to Visit  Medication Sig Dispense Refill  . norethindrone-ethinyl estradiol (JUNEL 1/20) 1-20 MG-MCG tablet Take 1 tablet by mouth daily. 3 Package 3   No current facility-administered medications on file prior to visit.     BP (!) 152/94   Pulse 93   Temp 99.4 F (37.4 C) (Oral)   Ht 5\' 2"  (1.575 m)   Wt 202 lb 6.4 oz (91.8 kg)   LMP 03/20/2016   SpO2 98%   BMI 37.02 kg/m    Objective:   Physical Exam  Constitutional: She appears well-nourished.  HENT:  Right Ear: Tympanic membrane and ear canal  normal.  Left Ear: Tympanic membrane and ear canal normal.  Nose: Right sinus exhibits no maxillary sinus tenderness and no frontal sinus tenderness. Left sinus exhibits no maxillary sinus tenderness and no frontal sinus tenderness.  Mouth/Throat: Oropharynx is clear and moist.  Eyes: Conjunctivae and EOM are normal. Pupils are equal, round, and reactive to light.  Neck: Neck supple. No thyromegaly present.  Cardiovascular: Normal rate and regular rhythm.   Murmur heard. Pulmonary/Chest: Effort normal and breath sounds normal. She has no wheezes. She has no rales.  Abdominal: Soft. Bowel sounds are normal. There is no tenderness.  Lymphadenopathy:    She has no  cervical adenopathy.  Skin: Skin is warm and dry.  Psychiatric: She has a normal mood and affect.          Assessment & Plan:  Fatigue:  Also with headaches, abdominal cramping (since dissipated), feeling "foggy" x 2 weeks. Also under increased stress at work and school. Exam today unremarkable.  Symptoms likely related to stress. Given history, will obtain labs to rule out metabolic cause. Labs for A1C, CMP, TSH pending. Discussed mechanisms to decrease stress. Also discussed treatment with NSAIDS for headaches. Will await lab results.  Morrie Sheldonlark,Velmer Broadfoot Kendal, NP

## 2016-03-30 LAB — COMPREHENSIVE METABOLIC PANEL
ALK PHOS: 60 U/L (ref 33–115)
ALT: 10 U/L (ref 6–29)
AST: 12 U/L (ref 10–30)
Albumin: 3.8 g/dL (ref 3.6–5.1)
BUN: 7 mg/dL (ref 7–25)
CHLORIDE: 105 mmol/L (ref 98–110)
CO2: 23 mmol/L (ref 20–31)
CREATININE: 0.78 mg/dL (ref 0.50–1.10)
Calcium: 8.9 mg/dL (ref 8.6–10.2)
Glucose, Bld: 92 mg/dL (ref 65–99)
Potassium: 3.9 mmol/L (ref 3.5–5.3)
SODIUM: 137 mmol/L (ref 135–146)
Total Bilirubin: 0.3 mg/dL (ref 0.2–1.2)
Total Protein: 7 g/dL (ref 6.1–8.1)

## 2016-04-17 MED FILL — LARIN 21 1-20 TABLET: 1-20 | 84 days supply | Qty: 63 | Fill #2

## 2016-04-27 ENCOUNTER — Other Ambulatory Visit: Payer: Self-pay | Admitting: Primary Care

## 2016-04-27 DIAGNOSIS — D509 Iron deficiency anemia, unspecified: Secondary | ICD-10-CM

## 2016-05-03 ENCOUNTER — Other Ambulatory Visit: Payer: 59

## 2016-05-03 ENCOUNTER — Other Ambulatory Visit (INDEPENDENT_AMBULATORY_CARE_PROVIDER_SITE_OTHER): Payer: 59

## 2016-05-03 DIAGNOSIS — D509 Iron deficiency anemia, unspecified: Secondary | ICD-10-CM | POA: Diagnosis not present

## 2016-05-03 LAB — CBC
HEMATOCRIT: 33.2 % — AB (ref 36.0–46.0)
Hemoglobin: 10.8 g/dL — ABNORMAL LOW (ref 12.0–15.0)
MCHC: 32.4 g/dL (ref 30.0–36.0)
MCV: 77.5 fl — ABNORMAL LOW (ref 78.0–100.0)
Platelets: 534 10*3/uL — ABNORMAL HIGH (ref 150.0–400.0)
RBC: 4.28 Mil/uL (ref 3.87–5.11)
RDW: 18 % — AB (ref 11.5–15.5)
WBC: 11.4 10*3/uL — ABNORMAL HIGH (ref 4.0–10.5)

## 2016-05-03 LAB — IBC PANEL
IRON: 216 ug/dL — AB (ref 42–145)
SATURATION RATIOS: 42.7 % (ref 20.0–50.0)
Transferrin: 361 mg/dL — ABNORMAL HIGH (ref 212.0–360.0)

## 2016-06-27 ENCOUNTER — Ambulatory Visit (INDEPENDENT_AMBULATORY_CARE_PROVIDER_SITE_OTHER): Payer: 59 | Admitting: Obstetrics & Gynecology

## 2016-06-27 ENCOUNTER — Encounter: Payer: Self-pay | Admitting: Obstetrics & Gynecology

## 2016-06-27 VITALS — BP 136/94 | HR 92 | Resp 18 | Ht 62.0 in | Wt 202.0 lb

## 2016-06-27 DIAGNOSIS — Z01419 Encounter for gynecological examination (general) (routine) without abnormal findings: Secondary | ICD-10-CM

## 2016-06-27 DIAGNOSIS — Z0000 Encounter for general adult medical examination without abnormal findings: Secondary | ICD-10-CM | POA: Diagnosis not present

## 2016-06-27 NOTE — Patient Instructions (Signed)
Preventive Care 18-39 Years, Female Preventive care refers to lifestyle choices and visits with your health care provider that can promote health and wellness. What does preventive care include?  A yearly physical exam. This is also called an annual well check.  Dental exams once or twice a year.  Routine eye exams. Ask your health care provider how often you should have your eyes checked.  Personal lifestyle choices, including:  Daily care of your teeth and gums.  Regular physical activity.  Eating a healthy diet.  Avoiding tobacco and drug use.  Limiting alcohol use.  Practicing safe sex.  Taking vitamin and mineral supplements as recommended by your health care provider. What happens during an annual well check? The services and screenings done by your health care provider during your annual well check will depend on your age, overall health, lifestyle risk factors, and family history of disease. Counseling  Your health care provider may ask you questions about your:  Alcohol use.  Tobacco use.  Drug use.  Emotional well-being.  Home and relationship well-being.  Sexual activity.  Eating habits.  Work and work environment.  Method of birth control.  Menstrual cycle.  Pregnancy history. Screening  You may have the following tests or measurements:  Height, weight, and BMI.  Diabetes screening. This is done by checking your blood sugar (glucose) after you have not eaten for a while (fasting).  Blood pressure.  Lipid and cholesterol levels. These may be checked every 5 years starting at age 20.  Skin check.  Hepatitis C blood test.  Hepatitis B blood test.  Sexually transmitted disease (STD) testing.  BRCA-related cancer screening. This may be done if you have a family history of breast, ovarian, tubal, or peritoneal cancers.  Pelvic exam and Pap test. This may be done every 3 years starting at age 21. Starting at age 30, this may be done every 5  years if you have a Pap test in combination with an HPV test. Discuss your test results, treatment options, and if necessary, the need for more tests with your health care provider. Vaccines  Your health care provider may recommend certain vaccines, such as:  Influenza vaccine. This is recommended every year.  Tetanus, diphtheria, and acellular pertussis (Tdap, Td) vaccine. You may need a Td booster every 10 years.  Varicella vaccine. You may need this if you have not been vaccinated.  HPV vaccine. If you are 26 or younger, you may need three doses over 6 months.  Measles, mumps, and rubella (MMR) vaccine. You may need at least one dose of MMR. You may also need a second dose.  Pneumococcal 13-valent conjugate (PCV13) vaccine. You may need this if you have certain conditions and were not previously vaccinated.  Pneumococcal polysaccharide (PPSV23) vaccine. You may need one or two doses if you smoke cigarettes or if you have certain conditions.  Meningococcal vaccine. One dose is recommended if you are age 19-21 years and a first-year college student living in a residence hall, or if you have one of several medical conditions. You may also need additional booster doses.  Hepatitis A vaccine. You may need this if you have certain conditions or if you travel or work in places where you may be exposed to hepatitis A.  Hepatitis B vaccine. You may need this if you have certain conditions or if you travel or work in places where you may be exposed to hepatitis B.  Haemophilus influenzae type b (Hib) vaccine. You may need this   if you have certain risk factors. Talk to your health care provider about which screenings and vaccines you need and how often you need them. This information is not intended to replace advice given to you by your health care provider. Make sure you discuss any questions you have with your health care provider. Document Released: 07/09/2001 Document Revised: 01/31/2016  Document Reviewed: 03/14/2015 Elsevier Interactive Patient Education  2017 [employer].

## 2016-06-27 NOTE — Progress Notes (Signed)
GYNECOLOGY ANNUAL PREVENTATIVE CARE ENCOUNTER NOTE  Subjective:   Veronica Foster is a 31 y.o. G0P0000 female here for a routine annual gynecologic exam.  Current complaints: none.   Denies abnormal vaginal bleeding, discharge, pelvic pain, problems with intercourse or other gynecologic concerns.    Gynecologic History Patient's last menstrual period was 06/10/2016. Contraception: OCP (estrogen/progesterone); also used for PCOS. Last Pap: 2014. Results were: normal  Obstetric History OB History  Gravida Para Term Preterm AB Living  0 0 0 0 0 0  SAB TAB Ectopic Multiple Live Births  0 0 0 0 0      ggf  Past Medical History:  Diagnosis Date  . Chicken pox   . GERD (gastroesophageal reflux disease)   . History of UTI   . Migraine   . Murmur   . PCOS (polycystic ovarian syndrome) 12/30/2014    Past Surgical History:  Procedure Laterality Date  . CHOLECYSTECTOMY OPEN  2014    Current Outpatient Prescriptions on File Prior to Visit  Medication Sig Dispense Refill  . norethindrone-ethinyl estradiol (JUNEL 1/20) 1-20 MG-MCG tablet Take 1 tablet by mouth daily. 3 Package 3   No current facility-administered medications on file prior to visit.     No Known Allergies  Social History   Social History  . Marital status: Single    Spouse name: N/A  . Number of children: N/A  . Years of education: N/A   Occupational History  . Not on file.   Social History Main Topics  . Smoking status: Never Smoker  . Smokeless tobacco: Never Used  . Alcohol use 0.0 oz/week     Comment: social  . Drug use: No  . Sexual activity: Yes    Birth control/ protection: Pill   Other Topics Concern  . Not on file   Social History Narrative   Single.   Highest level of education is Masters.   Works for [employer] in [employer].   Enjoys traveling, reading, wine tasting.    Family History  Problem Relation Age of Onset  . Cancer Mother     colon  . Stroke Mother   .  Hypertension Mother   . Arthritis Paternal Aunt   . Hypertension Maternal Grandmother   . Cancer Maternal Grandfather     prostate/pancreatic  . Hypertension Maternal Grandfather   . Arthritis Paternal Grandmother   . Hypertension Paternal Grandmother   . Cancer Paternal Grandfather     prostate  . Heart disease Paternal Grandfather   . Stroke Paternal Grandfather   . Hypertension Paternal Grandfather     The following portions of the patient's history were reviewed and updated as appropriate: allergies, current medications, past family history, past medical history, past social history, past surgical history and problem list.  Review of Systems Pertinent items noted in HPI and remainder of comprehensive ROS otherwise negative.   Objective:  Pulse 92   Resp 18   Ht 5\' 2"  (1.575 m)   Wt 202 lb (91.6 kg)   LMP 06/10/2016   BMI 36.95 kg/m  CONSTITUTIONAL: Well-developed, well-nourished female in no acute distress.  HENT:  Normocephalic, atraumatic, External right and left ear normal. Oropharynx is clear and moist EYES: Conjunctivae and EOM are normal. Pupils are equal, round, and reactive to light. No scleral icterus.  NECK: Normal range of motion, supple, no masses.  Normal thyroid.  SKIN: Skin is warm and dry. No rash noted. Not diaphoretic. No erythema. No pallor. NEUROLOGIC: Alert  and oriented to person, place, and time. Normal reflexes, muscle tone coordination. No cranial nerve deficit noted. PSYCHIATRIC: Normal mood and affect. Normal behavior. Normal judgment and thought content. CARDIOVASCULAR: Normal heart rate noted, regular rhythm RESPIRATORY: Clear to auscultation bilaterally. Effort and breath sounds normal, no problems with respiration noted. BREASTS: Symmetric in size. No masses, skin changes, nipple drainage, or lymphadenopathy. ABDOMEN: Soft, normal bowel sounds, no distention noted.  No tenderness, rebound or guarding.  PELVIC: Normal appearing external  genitalia; normal appearing vaginal mucosa and cervix.  No abnormal discharge noted.  Pap smear obtained.  Normal uterine size, no other palpable masses, no uterine or adnexal tenderness. MUSCULOSKELETAL: Normal range of motion. No tenderness.  No cyanosis, clubbing, or edema.  2+ distal pulses.   Assessment:  Annual gynecologic examination with pap smear   Plan:  Will follow up results of pap smear and manage accordingly. Routine preventative health maintenance measures emphasized. Please refer to After Visit Summary for other counseling recommendations.    Jaynie CollinsUGONNA  Shawndale Kilpatrick, MD, FACOG Attending Obstetrician & Gynecologist, [facility]

## 2016-07-02 LAB — CYTOLOGY - PAP
DIAGNOSIS: NEGATIVE
HPV: NOT DETECTED

## 2016-07-04 MED FILL — LARIN 21 1-20 TABLET: 1-20 | 84 days supply | Qty: 63 | Fill #3

## 2016-09-05 ENCOUNTER — Telehealth: Payer: Self-pay | Admitting: *Deleted

## 2016-09-05 NOTE — Telephone Encounter (Signed)
Pt contacted office requesting refill of BCP. Pt recently saw GYN for pap smear. Per Jae Dire, she will need to contact them for further refills.

## 2016-09-18 ENCOUNTER — Telehealth: Payer: Self-pay | Admitting: *Deleted

## 2016-09-18 DIAGNOSIS — E282 Polycystic ovarian syndrome: Secondary | ICD-10-CM

## 2016-09-18 MED ORDER — NORETHINDRONE ACET-ETHINYL EST 1-20 MG-MCG PO TABS: 1.0000 | ORAL_TABLET | Freq: Every day | ORAL | 3 refills | Status: DC

## 2016-09-18 NOTE — Telephone Encounter (Signed)
Refill sent to the pt pharmacy. 

## 2016-09-18 NOTE — Telephone Encounter (Signed)
-----   Message from Lindell Spar, [place] sent at 09/18/2016 11:23 AM EDT ----- Regarding: pt needs [facility] refill  Contact: [phone] Pt was seen in 06/2016, new Gyn in which she had her annual done, but still had bc remaining, has now run out and needs new Rx for 1 yr sent to [facility] pharmacy

## 2016-09-30 MED FILL — LARIN 21 1-20 TABLET: 1-20 | 84 days supply | Qty: 63 | Fill #0

## 2016-10-01 ENCOUNTER — Encounter: Payer: Self-pay | Admitting: Primary Care

## 2016-10-01 ENCOUNTER — Ambulatory Visit (INDEPENDENT_AMBULATORY_CARE_PROVIDER_SITE_OTHER): Payer: 59 | Admitting: Primary Care

## 2016-10-01 VITALS — BP 138/86 | HR 106 | Temp 98.1°F | Ht 62.0 in | Wt 206.0 lb

## 2016-10-01 DIAGNOSIS — J069 Acute upper respiratory infection, unspecified: Secondary | ICD-10-CM | POA: Diagnosis not present

## 2016-10-01 MED ORDER — HYDROCOD POLST-CPM POLST ER 10-8 MG/5ML PO SUER: 5.0000 mL | Freq: Two times a day (BID) | ORAL | 0 refills | Status: DC | PRN

## 2016-10-01 MED FILL — HYDROCODONE-CHLORPHEN ER SU: 10-8 | 7 days supply | Qty: 70 | Fill #0

## 2016-10-01 NOTE — Progress Notes (Signed)
Pre visit review using our clinic review tool, if applicable. No additional management support is needed unless otherwise documented below in the visit note. 

## 2016-10-01 NOTE — Progress Notes (Signed)
Subjective:    Patient ID: Veronica CommonsKrista Foster, female    DOB: 01/26/1986, 31 y.o.   MRN: [id number]  HPI  Veronica Foster is a 31 year old female who presents today with a chief complaint of cough. She also reports sore throat, fatigue, mild chest congestion. She was recently around a baby with URI symptoms. She denies fevers, sinus pressure, nasal congestion. Her cough is mostly dry. She's taken [employer]-Tussin without much improvement. Her symptoms began three days ago.   Review of Systems  Constitutional: Positive for fatigue. Negative for chills and fever.  HENT: Positive for congestion and sore throat. Negative for ear pain and sinus pressure.   Respiratory: Positive for cough. Negative for shortness of breath and wheezing.   Cardiovascular: Negative for chest pain.       Past Medical History:  Diagnosis Date  . Chicken pox   . GERD (gastroesophageal reflux disease)   . History of UTI   . Migraine   . Murmur   . PCOS (polycystic ovarian syndrome) 12/30/2014     Social History   Social History  . Marital status: Single    Spouse name: N/A  . Number of children: N/A  . Years of education: N/A   Occupational History  . Not on file.   Social History Main Topics  . Smoking status: Never Smoker  . Smokeless tobacco: Never Used  . Alcohol use 0.0 oz/week     Comment: social  . Drug use: No  . Sexual activity: Yes    Birth control/ protection: Pill   Other Topics Concern  . Not on file   Social History Narrative   Single.   Highest level of education is Masters.   Works for [employer] in [employer].   Enjoys traveling, reading, wine tasting.    Past Surgical History:  Procedure Laterality Date  . CHOLECYSTECTOMY OPEN  2014    Family History  Problem Relation Age of Onset  . Cancer Mother     colon  . Stroke Mother   . Hypertension Mother   . Arthritis Paternal Aunt   . Hypertension Maternal Grandmother   . Cancer Maternal Grandfather     prostate/pancreatic    . Hypertension Maternal Grandfather   . Arthritis Paternal Grandmother   . Hypertension Paternal Grandmother   . Cancer Paternal Grandfather     prostate  . Heart disease Paternal Grandfather   . Stroke Paternal Grandfather   . Hypertension Paternal Grandfather     No Known Allergies  Current Outpatient Prescriptions on File Prior to Visit  Medication Sig Dispense Refill  . ferrous sulfate 325 (65 FE) MG tablet Take 325 mg by mouth 2 (two) times a week.    . norethindrone-ethinyl estradiol (JUNEL 1/20) 1-20 MG-MCG tablet Take 1 tablet by mouth daily. 3 Package 3   No current facility-administered medications on file prior to visit.     BP 138/86   Pulse (!) 106   Temp 98.1 F (36.7 C) (Oral)   Ht 5\' 2"  (1.575 m)   Wt 206 lb (93.4 kg)   LMP 09/28/2016   SpO2 98%   BMI 37.68 kg/m    Objective:   Physical Exam  Constitutional: She appears well-nourished. She does not appear ill.  HENT:  Right Ear: Tympanic membrane and ear canal normal.  Left Ear: Tympanic membrane and ear canal normal.  Nose: Mucosal edema present. Right sinus exhibits no maxillary sinus tenderness and no frontal sinus tenderness. Left sinus exhibits  no maxillary sinus tenderness and no frontal sinus tenderness.  Mouth/Throat: Oropharynx is clear and moist.  Eyes: Conjunctivae are normal.  Neck: Neck supple.  Cardiovascular: Normal rate and regular rhythm.   Pulmonary/Chest: Effort normal and breath sounds normal. She has no wheezes. She has no rales.  Congested cough during exam  Lymphadenopathy:    She has no cervical adenopathy.  Skin: Skin is warm and dry.          Assessment & Plan:  URI:  Cough, fatigue, sore throat x 3 days. Little to no improvement with OTC treatment. Exam today with clear lungs, congested cough during exam.  Suspect viral involvement and will treat with supportive measures. Rx for Tussionex provided. Delsym DM for day time cough and congestion. Continue  Zyrtec. Fluids, rest, follow up PRN.  Morrie Sheldon, NP

## 2016-10-01 NOTE — Patient Instructions (Signed)
You may take the Tussionex cough suppressant twice daily as needed for cough and rest. Caution this medication contains codeine and will make you feel drowsy.  Try Delsym DM for cough and congestion. Make sure you take this with water to help break up chest congestion.  Continue Zyrtec.  Please notify me if you develop persistent fevers of 101, start coughing up green mucous, notice increased fatigue or weakness, or feel worse after 1 week of onset of symptoms.   Increase consumption of water intake and rest.  It was a pleasure to see you today!

## 2016-12-17 MED FILL — LARIN 21 1-20 TABLET: 1-20 | 84 days supply | Qty: 63 | Fill #1

## 2017-01-30 ENCOUNTER — Telehealth: Payer: Self-pay | Admitting: Primary Care

## 2017-01-30 NOTE — Telephone Encounter (Signed)
Pt has appt on 01/31/17 at 10:15 with Dr Reece AgarG.

## 2017-01-30 NOTE — Telephone Encounter (Signed)
[facility] - Client TELEPHONE ADVICE RECORD [facility]  Patient Name: Veronica CommonsKRISTA Foster  DOB: 07/26/1985    Initial Comment Caller has been having diarrhea, moderate abdominal pain.    Nurse Assessment  Nurse: Odis LusterBowers, RN, Bjorn Loserhonda Date/Time (Eastern Time): 01/30/2017 2:10:14 PM  Confirm and document reason for call. If symptomatic, describe symptoms. ---Caller has been having diarrhea, moderate abdominal pain. Denies fever.  Does the patient have any new or worsening symptoms? ---Yes  Will a triage be completed? ---Yes  Related visit to physician within the last 2 weeks? ---No  Does the PT have any chronic conditions? (i.e. diabetes, asthma, etc.) ---Yes  List chronic conditions. ---anemia; PCOS  Is the patient pregnant or possibly pregnant? (Ask all females between the ages of 5212-55) ---No  Is this a behavioral health or substance abuse call? ---No     Guidelines    Guideline Title Affirmed Question Affirmed Notes  Abdominal Pain - Upper [1] MODERATE pain (e.g., interferes with normal activities) AND [2] comes and goes (cramps) AND [3] present > 24 hours (Exception: pain with Vomiting or Diarrhea - see that Guideline)    Final Disposition User   See Physician within 24 Hours Odis LusterBowers, RN, Bjorn Loserhonda    Comments  Appt scheduled for tomorrow with Dr. Sharen HonesGutierrez at 1015 am at the [facility].   Referrals  REFERRED TO PCP OFFICE   Disagree/Comply: Comply

## 2017-01-31 ENCOUNTER — Encounter: Payer: Self-pay | Admitting: *Deleted

## 2017-01-31 ENCOUNTER — Encounter: Payer: Self-pay | Admitting: Family Medicine

## 2017-01-31 ENCOUNTER — Ambulatory Visit (INDEPENDENT_AMBULATORY_CARE_PROVIDER_SITE_OTHER): Payer: 59 | Admitting: Family Medicine

## 2017-01-31 VITALS — BP 118/84 | HR 84 | Temp 98.1°F | Wt 205.2 lb

## 2017-01-31 DIAGNOSIS — R197 Diarrhea, unspecified: Secondary | ICD-10-CM

## 2017-01-31 MED ORDER — CIPROFLOXACIN HCL 500 MG PO TABS: 500.0000 mg | ORAL_TABLET | Freq: Two times a day (BID) | ORAL | 0 refills | Status: DC

## 2017-01-31 MED FILL — CIPROFLOXACIN HCL 500 MG TA: 500 | 3 days supply | Qty: 6 | Fill #0

## 2017-01-31 NOTE — Progress Notes (Signed)
BP 118/84   Pulse 84   Temp 98.1 F (36.7 C) (Oral)   Wt 205 lb 4 oz (93.1 kg)   LMP 01/18/2017   SpO2 100%   BMI 37.54 kg/m    CC: abd pain, diarrhea Subjective:    Patient ID: Veronica Foster, female    DOB: 03/09/1986, 31 y.o.   MRN: [id number]  HPI: Veronica Foster is a 31 y.o. female presenting on 01/31/2017 for Diarrhea and Abdominal Pain   Patient of Graylon Gunning new to me presents with 1.5 wk h/o diarrhea x2-3 times. This has been accompanied by nausea and abd discomfort. Imodium started 2 days ago - this has calmed down diarrhea. Ongoing abd discomfort (dull alternating with sharp pain). Some mucous in stool. Looser stools, not frank watery.   Has also been treating with BRAT diet, water, pedialyte, gingerale.  Denies fevers/chills, blood in stool, vomiting. No new rash or joint pains or red eyes.   No recent travel or new restaurants. No recent antibiotics. No sick contacts at home.  Gallbladder removed 2014 - similar issues prior to gallbladder removal.   Relevant past medical, surgical, family and social history reviewed and updated as indicated. Interim medical history since our last visit reviewed. Allergies and medications reviewed and updated. Outpatient Medications Prior to Visit  Medication Sig Dispense Refill  . ferrous sulfate 325 (65 FE) MG tablet Take 325 mg by mouth 2 (two) times a week.    . norethindrone-ethinyl estradiol (JUNEL 1/20) 1-20 MG-MCG tablet Take 1 tablet by mouth daily. 3 Package 3  . cetirizine (ZYRTEC) 10 MG tablet Take 10 mg by mouth daily as needed for allergies.    . chlorpheniramine-HYDROcodone (TUSSIONEX PENNKINETIC ER) 10-8 MG/5ML SUER Take 5 mLs by mouth every 12 (twelve) hours as needed for cough. 70 mL 0  . Dextromethorphan HBr (WAL-TUSSIN COUGH PO) Take by mouth as needed.    Marland Kitchen ketotifen (ZADITOR) 0.025 % ophthalmic solution 1 drop 2 (two) times daily.     No facility-administered medications prior to visit.      Per HPI unless  specifically indicated in ROS section below Review of Systems     Objective:    BP 118/84   Pulse 84   Temp 98.1 F (36.7 C) (Oral)   Wt 205 lb 4 oz (93.1 kg)   LMP 01/18/2017   SpO2 100%   BMI 37.54 kg/m   Wt Readings from Last 3 Encounters:  01/31/17 205 lb 4 oz (93.1 kg)  10/01/16 206 lb (93.4 kg)  06/27/16 202 lb (91.6 kg)    Physical Exam  Constitutional: She appears well-developed and well-nourished. No distress.  HENT:  Mouth/Throat: Oropharynx is clear and moist. No oropharyngeal exudate.  Cardiovascular: Normal rate, regular rhythm, normal heart sounds and intact distal pulses.   No murmur heard. Pulmonary/Chest: Effort normal and breath sounds normal. No respiratory distress. She has no wheezes. She has no rales.  Abdominal: Soft. Normal appearance and bowel sounds are normal. She exhibits no distension and no mass. There is tenderness in the epigastric area and suprapubic area. There is no rigidity, no rebound, no guarding, no CVA tenderness and negative Murphy's sign.  Musculoskeletal: She exhibits no edema.  Skin: Skin is warm and dry. No rash noted.  Psychiatric: She has a normal mood and affect.  Nursing note and vitals reviewed.     Assessment & Plan:   Problem List Items Addressed This Visit    Diarrhea - Primary    Associated  with abd discomfort and nausea, no fevers or blood in stool. Anticipate infectious cause - given prolonged duration >10 days I did recommend empiric treatment with cipro abx.  Benign abdominal exam, no signs of autoimmune cause.  Supportive care reviewed.  Work note and school note provided.  Advised if not improving into next week, update us to consider stool study and blood work. Pt agrees with plan.           Follow up plan: Return if symptoms worsen or fail to improve.  Eustaquio BoydenJavier Sheila Ocasio, MD

## 2017-01-31 NOTE — Assessment & Plan Note (Signed)
Associated with abd discomfort and nausea, no fevers or blood in stool. Anticipate infectious cause - given prolonged duration >10 days I did recommend empiric treatment with cipro abx.  Benign abdominal exam, no signs of autoimmune cause.  Supportive care reviewed.  Work note and school note provided.  Advised if not improving into next week, update us to consider stool study and blood work. Pt agrees with plan.

## 2017-01-31 NOTE — Patient Instructions (Signed)
I think this is infectious - treat with cipro antibiotic sent to pharmacy. Continue other treatment as up to now - imodium as needed, push fluids and rest, bland diet until feeling better then advance as tolerated.  If ongoing symptoms despite antibiotic, let us know next week for possible blood work and stool test.

## 2017-03-11 MED FILL — LARIN 21 1-20 TABLET: 1-20 | 84 days supply | Qty: 63 | Fill #2

## 2017-03-17 ENCOUNTER — Telehealth: Payer: Self-pay | Admitting: Radiology

## 2017-03-17 NOTE — Telephone Encounter (Signed)
Left voicemail on cell phone to call back to schedule appointment with Dr Macon LargeAnyanwu

## 2017-03-18 ENCOUNTER — Encounter: Payer: Self-pay | Admitting: Obstetrics & Gynecology

## 2017-03-18 ENCOUNTER — Other Ambulatory Visit: Payer: Self-pay | Admitting: Obstetrics & Gynecology

## 2017-03-18 ENCOUNTER — Ambulatory Visit (INDEPENDENT_AMBULATORY_CARE_PROVIDER_SITE_OTHER): Payer: 59 | Admitting: Obstetrics & Gynecology

## 2017-03-18 VITALS — BP 159/113 | HR 82 | Ht 62.0 in | Wt 203.0 lb

## 2017-03-18 DIAGNOSIS — N945 Secondary dysmenorrhea: Secondary | ICD-10-CM | POA: Diagnosis not present

## 2017-03-18 DIAGNOSIS — E282 Polycystic ovarian syndrome: Secondary | ICD-10-CM

## 2017-03-18 NOTE — Progress Notes (Signed)
   GYNECOLOGY OFFICE VISIT NOTE  History:  31 y.o. G0P0000 here today for evaluation of new onset dysmenorrhea for past 3 cycles.  On Junel 1/20 for PCOS, has been on this for years. For last three cycles, noted severe dysmenorrhea and lighter bleeding (one day of light bleeding and two days of spotting). Dysmenorrhea alleviated by Ibuprofen.  She tool home UPT that was negative, [facility] which [facility] also [facility]. Last cycle, pain improved but still has light bleeding. She wanted to make sure all is well. She denies any current abnormal vaginal discharge, bleeding, pelvic pain or other concerns.   Past Medical History:  Diagnosis Date  . Chicken pox   . GERD (gastroesophageal reflux disease)   . History of UTI   . Migraine   . Murmur   . PCOS (polycystic ovarian syndrome) 12/30/2014    Past Surgical History:  Procedure Laterality Date  . CHOLECYSTECTOMY OPEN  2014    The following portions of the patient's history were reviewed and updated as appropriate: allergies, current medications, past family history, past medical history, past social history, past surgical history and problem list.   Health Maintenance:  Normal pap and negative HRHPV on 2/12018.    Review of Systems:  Pertinent items noted in HPI and remainder of comprehensive ROS otherwise negative.   Objective:  Physical Exam BP (!) 159/113   Pulse 82   Ht 5\' 2"  (1.575 m)   Wt 203 lb (92.1 kg)   LMP 03/15/2017   BMI 37.13 kg/m  CONSTITUTIONAL: Well-developed, well-nourished female in no acute distress.  HENT:  Normocephalic, atraumatic. External right and left ear normal. Oropharynx is clear and moist EYES: Conjunctivae and EOM are normal. Pupils are equal, round, and reactive to light. No scleral icterus.  NECK: Normal range of motion, supple, no masses SKIN: Skin is warm and dry. No rash noted. Not diaphoretic. No erythema. No pallor. NEUROLOGIC: Alert and oriented to person, place, and time.  Normal reflexes, muscle tone coordination. No cranial nerve deficit noted. PSYCHIATRIC: Normal mood and affect. Normal behavior. Normal judgment and thought content. CARDIOVASCULAR: Normal heart rate noted RESPIRATORY: Effort and breath sounds normal, no problems with respiration noted ABDOMEN: Soft, no distention noted. Nontender  PELVIC: Deferred MUSCULOSKELETAL: Normal range of motion. No edema noted.  Labs and Imaging No results found.  Assessment & Plan:  1. Secondary dysmenorrhea 2. PCOS (polycystic ovarian syndrome) Concerned about possible ovarian cyst or uterine lesion; reassured by lighter bleeding and resolving pain.  Will follow up scan results. Continue OCPs. - US PELVIC COMPLETE WITH TRANSVAGINAL; Future Will watch BP (white coat hypertension); patient will check at home/work and let us know if still available. Routine preventative health maintenance measures emphasized. Please refer to After Visit Summary for other counseling recommendations.   Return if symptoms worsen or fail to improve.  Total face-to-face time with patient: 25 minutes. Over 50% of encounter was spent on counseling and coordination of care.   Jaynie CollinsUGONNA  Chade Pitner, MD, FACOG Attending Obstetrician & Gynecologist, [facility], [facility]

## 2017-03-18 NOTE — Patient Instructions (Signed)
Return to clinic for any scheduled appointments or for any gynecologic concerns as needed.   

## 2017-03-25 ENCOUNTER — Ambulatory Visit
Admission: RE | Admit: 2017-03-25 | Discharge: 2017-03-25 | Disposition: A | Discharge status: Home / Self Care | Payer: 59 | Source: Ambulatory Visit | Attending: Obstetrics & Gynecology | Admitting: Obstetrics & Gynecology

## 2017-03-25 DIAGNOSIS — N946 Dysmenorrhea, unspecified: Secondary | ICD-10-CM | POA: Diagnosis not present

## 2017-03-25 DIAGNOSIS — N945 Secondary dysmenorrhea: Secondary | ICD-10-CM

## 2017-04-04 NOTE — Telephone Encounter (Signed)
error 

## 2017-06-05 MED FILL — LARIN 21 1-20 TABLET: 1-20 | 84 days supply | Qty: 63 | Fill #3

## 2017-08-08 ENCOUNTER — Ambulatory Visit: Payer: 59 | Admitting: Primary Care

## 2017-08-08 ENCOUNTER — Encounter: Payer: Self-pay | Admitting: Primary Care

## 2017-08-08 VITALS — BP 122/78 | HR 100 | Temp 98.1°F | Ht 63.0 in | Wt 202.8 lb

## 2017-08-08 DIAGNOSIS — D509 Iron deficiency anemia, unspecified: Secondary | ICD-10-CM

## 2017-08-08 DIAGNOSIS — G43001 Migraine without aura, not intractable, with status migrainosus: Secondary | ICD-10-CM

## 2017-08-08 DIAGNOSIS — Z0000 Encounter for general adult medical examination without abnormal findings: Secondary | ICD-10-CM

## 2017-08-08 DIAGNOSIS — R7303 Prediabetes: Secondary | ICD-10-CM

## 2017-08-08 DIAGNOSIS — E669 Obesity, unspecified: Secondary | ICD-10-CM

## 2017-08-08 NOTE — Progress Notes (Signed)
Subjective:    Patient ID: Veronica Foster, female    DOB: 07/29/1985, 32 y.o.   MRN: [id number]  HPI  Veronica Foster is a 32 year old female who presents today for complete physical.  Immunizations: -Tetanus: Completed in 2017 -Influenza: Completed this season   Diet: She endorses a fair diet. Breakfast: Eggs, potatoes, fruit, yogurt  Lunch: [employer], chips, soy chicken patties, salad Dinner: Potato, vegetables, rice  Snacks: Occasionally, peanut butter crackers Desserts: once weekly Beverages: Water, juice, occasional soda  Exercise: She is taking dance classes twice weekly, going twice weekly to gym. Eye exam: Due next week.  Dental exam: Completes twice yearly Pap Smear: Completed in 2018, follows with GYN   Review of Systems  Constitutional: Negative for unexpected weight change.  HENT: Negative for rhinorrhea.   Respiratory: Negative for cough and shortness of breath.   Cardiovascular: Negative for chest pain.  Gastrointestinal: Negative for constipation and diarrhea.  Genitourinary: Negative for difficulty urinating and menstrual problem.  Musculoskeletal: Negative for arthralgias and myalgias.  Skin: Negative for rash.  Allergic/Immunologic: Negative for environmental allergies.  Neurological: Negative for dizziness, numbness and headaches.  Psychiatric/Behavioral: The patient is not nervous/anxious.        Past Medical History:  Diagnosis Date  . Chicken pox   . GERD (gastroesophageal reflux disease)   . History of UTI   . Migraine   . Murmur   . PCOS (polycystic ovarian syndrome) 12/30/2014     Social History   Socioeconomic History  . Marital status: Single    Spouse name: Not on file  . Number of children: Not on file  . Years of education: Not on file  . Highest education level: Not on file  Social Needs  . Financial resource strain: Not on file  . Food insecurity - worry: Not on file  . Food insecurity - inability: Not on file  . Transportation  needs - medical: Not on file  . Transportation needs - non-medical: Not on file  Occupational History  . Not on file  Tobacco Use  . Smoking status: Never Smoker  . Smokeless tobacco: Never Used  Substance and Sexual Activity  . Alcohol use: Yes    Alcohol/week: 0.0 oz    Comment: social  . Drug use: No  . Sexual activity: Yes    Birth control/protection: Pill  Other Topics Concern  . Not on file  Social History Narrative   Single.   Highest level of education is Masters.   Works for [employer] in [employer].   Enjoys traveling, reading, wine tasting.    Past Surgical History:  Procedure Laterality Date  . CHOLECYSTECTOMY OPEN  2014    Family History  Problem Relation Age of Onset  . Cancer Mother        colon  . Stroke Mother   . Hypertension Mother   . Arthritis Paternal Aunt   . Hypertension Maternal Grandmother   . Cancer Maternal Grandfather        prostate/pancreatic  . Hypertension Maternal Grandfather   . Arthritis Paternal Grandmother   . Hypertension Paternal Grandmother   . Cancer Paternal Grandfather        prostate  . Heart disease Paternal Grandfather   . Stroke Paternal Grandfather   . Hypertension Paternal Grandfather     No Known Allergies  Current Outpatient Medications on File Prior to Visit  Medication Sig Dispense Refill  . ferrous sulfate 325 (65 FE) MG tablet Take 325 mg  by mouth 2 (two) times a week.    . norethindrone-ethinyl estradiol (JUNEL 1/20) 1-20 MG-MCG tablet Take 1 tablet by mouth daily. 3 Package 3   No current facility-administered medications on file prior to visit.     BP 122/78 (BP Location: Left Arm, Patient Position: Sitting, Cuff Size: Large)   Pulse 100   Temp 98.1 F (36.7 C) (Oral)   Ht 5\' 3"  (1.6 m)   Wt 202 lb 12.8 oz (92 kg)   BMI 35.92 kg/m    Objective:   Physical Exam  Constitutional: She is oriented to person, place, and time. She appears well-nourished.  HENT:  Right Ear: Tympanic  membrane and ear canal normal.  Left Ear: Tympanic membrane and ear canal normal.  Nose: Nose normal.  Mouth/Throat: Oropharynx is clear and moist.  Eyes: Conjunctivae and EOM are normal. Pupils are equal, round, and reactive to light.  Neck: Neck supple. No thyromegaly present.  Cardiovascular: Normal rate and regular rhythm.  No murmur heard. Pulmonary/Chest: Effort normal and breath sounds normal. She has no rales.  Abdominal: Soft. Bowel sounds are normal. There is no tenderness.  Musculoskeletal: Normal range of motion.  Lymphadenopathy:    She has no cervical adenopathy.  Neurological: She is alert and oriented to person, place, and time. She has normal reflexes. No cranial nerve deficit.  Skin: Skin is warm and dry. No rash noted.  Psychiatric: She has a normal mood and affect.          Assessment & Plan:

## 2017-08-08 NOTE — Assessment & Plan Note (Signed)
Immunizations UTD. Pap smear UTD. Discussed the importance of a healthy diet and regular exercise in order for weight loss, and to reduce the risk of any potential medical problems. Exam unremarkable. Labs pending. Follow up in 1 year

## 2017-08-08 NOTE — Assessment & Plan Note (Signed)
Discussed the importance of a healthy diet and regular exercise in order for weight loss, and to reduce the risk of any potential medical problems.  

## 2017-08-08 NOTE — Assessment & Plan Note (Signed)
Repeat A1C pending. 

## 2017-08-08 NOTE — Assessment & Plan Note (Signed)
Repeat CBC and iron panel pending.  Continue oral iron. Following with GYN.

## 2017-08-08 NOTE — Patient Instructions (Signed)
Stop by the lab prior to leaving today. I will notify you of your results once received.   Continue exercising. You should be getting 150 minutes of moderate intensity exercise weekly.  Continue to work on a healthy diet. Make sure to eat whole grains (brown rice, quinoa, couscous).   Ensure you are consuming 64 ounces of water daily.  Follow up in 1 year for your annual exam or sooner if needed.  It was a pleasure to see you today!

## 2017-08-08 NOTE — Assessment & Plan Note (Signed)
No recent migraines.  

## 2017-08-09 LAB — COMPREHENSIVE METABOLIC PANEL
AG RATIO: 1.3 (calc) (ref 1.0–2.5)
ALBUMIN MSPROF: 4.1 g/dL (ref 3.6–5.1)
ALKALINE PHOSPHATASE (APISO): 67 U/L (ref 33–115)
ALT: 14 U/L (ref 6–29)
AST: 12 U/L (ref 10–30)
BILIRUBIN TOTAL: 0.4 mg/dL (ref 0.2–1.2)
BUN: 8 mg/dL (ref 7–25)
CALCIUM: 9.4 mg/dL (ref 8.6–10.2)
CHLORIDE: 106 mmol/L (ref 98–110)
CO2: 24 mmol/L (ref 20–32)
Creat: 0.8 mg/dL (ref 0.50–1.10)
GLOBULIN: 3.1 g/dL (ref 1.9–3.7)
Glucose, Bld: 83 mg/dL (ref 65–99)
POTASSIUM: 4.1 mmol/L (ref 3.5–5.3)
SODIUM: 138 mmol/L (ref 135–146)
Total Protein: 7.2 g/dL (ref 6.1–8.1)

## 2017-08-09 LAB — CBC
HCT: 38.1 % (ref 35.0–45.0)
HEMOGLOBIN: 12.5 g/dL (ref 11.7–15.5)
MCH: 27.2 pg (ref 27.0–33.0)
MCHC: 32.8 g/dL (ref 32.0–36.0)
MCV: 82.8 fL (ref 80.0–100.0)
MPV: 10.4 fL (ref 7.5–12.5)
Platelets: 472 10*3/uL — ABNORMAL HIGH (ref 140–400)
RBC: 4.6 10*6/uL (ref 3.80–5.10)
RDW: 12.6 % (ref 11.0–15.0)
WBC: 8.6 10*3/uL (ref 3.8–10.8)

## 2017-08-09 LAB — IRON,TIBC AND FERRITIN PANEL
%SAT: 16 % (calc) (ref 11–50)
Ferritin: 15 ng/mL (ref 10–154)
IRON: 60 ug/dL (ref 40–190)
TIBC: 372 mcg/dL (calc) (ref 250–450)

## 2017-08-09 LAB — LIPID PANEL
CHOL/HDL RATIO: 2.9 (calc) (ref <5.0)
Cholesterol: 145 mg/dL (ref <200)
HDL: 50 mg/dL — ABNORMAL LOW (ref >50)
LDL CHOLESTEROL (CALC): 82 mg/dL
Non-HDL Cholesterol (Calc): 95 mg/dL (calc) (ref <130)
TRIGLYCERIDES: 58 mg/dL (ref <150)

## 2017-08-09 LAB — HEMOGLOBIN A1C
Hgb A1c MFr Bld: 5.6 % of total Hgb (ref <5.7)
MEAN PLASMA GLUCOSE: 114 (calc)
eAG (mmol/L): 6.3 (calc)

## 2017-08-26 ENCOUNTER — Telehealth: Payer: Self-pay | Admitting: *Deleted

## 2017-08-26 DIAGNOSIS — E282 Polycystic ovarian syndrome: Secondary | ICD-10-CM

## 2017-08-26 MED ORDER — NORETHINDRONE ACET-ETHINYL EST 1-20 MG-MCG PO TABS: 1.0000 | ORAL_TABLET | Freq: Every day | ORAL | 3 refills | Status: DC

## 2017-08-26 MED FILL — LARIN 21 1-20 TABLET: 1-20 | 63 days supply | Qty: 63 | Fill #0

## 2017-08-26 NOTE — Telephone Encounter (Signed)
-----   Message from Lindell SparHeather L Bacon, [place] sent at 08/26/2017  3:32 PM EDT ----- [facility] Contact: [phone] Pt would like refill [facility], pt was instructed that she didn't need to do Annual For 3 years

## 2017-09-05 ENCOUNTER — Ambulatory Visit (INDEPENDENT_AMBULATORY_CARE_PROVIDER_SITE_OTHER): Payer: Self-pay | Admitting: Nurse Practitioner

## 2017-09-05 ENCOUNTER — Encounter: Payer: Self-pay | Admitting: Nurse Practitioner

## 2017-09-05 VITALS — BP 142/98 | HR 86 | Temp 98.6°F | Resp 16 | Wt 200.8 lb

## 2017-09-05 DIAGNOSIS — S80861A Insect bite (nonvenomous), right lower leg, initial encounter: Secondary | ICD-10-CM

## 2017-09-05 DIAGNOSIS — W57XXXA Bitten or stung by nonvenomous insect and other nonvenomous arthropods, initial encounter: Secondary | ICD-10-CM

## 2017-09-05 MED ORDER — MUPIROCIN CALCIUM 2 % EX CREA: 1.0000 "application " | TOPICAL_CREAM | Freq: Two times a day (BID) | CUTANEOUS | 0 refills | Status: DC

## 2017-09-05 MED ORDER — MUPIROCIN 2 % EX OINT: 1.0000 "application " | TOPICAL_OINTMENT | Freq: Two times a day (BID) | CUTANEOUS | 0 refills | Status: AC

## 2017-09-05 MED FILL — MUPIROCIN 2% OINTMENT: 2 | 10 days supply | Qty: 22 | Fill #0

## 2017-09-05 NOTE — Progress Notes (Signed)
   Subjective:    Patient ID: Veronica Foster, female    DOB: 02/17/1986, 32 y.o.   MRN: [id number]  She is a 32 year old female who presents for complaints of insect bites to her right lower extremity.  Patient states she noticed the bites when she got home from work one day ago.  Patient states 1 day ago her leg was achy at that ankle but denies swelling or redness.  Today patient has approximately 5 bites to the right lower extremity around starting at the ankle progressing upward.  Patient denies pruritus but does complain of achiness to her lower extremity that she rates 3 out of 10 at present.  Patient denies any difficulty ambulating drainage or discharge or increasing redness at the sites.  Patient has not applied any creams or ointments or taking any medications at home for her symptoms.  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  Patient suspects this may be flea bites to her ankle.   Review of Systems  Constitutional: Negative.   HENT: Negative.   Eyes: Negative.   Respiratory: Negative.   Cardiovascular: Negative.   Skin:       5 bites to right lower extremity  Neurological: Negative.        Objective:   Physical Exam  Constitutional: She is oriented to person, place, and time. She appears well-developed and well-nourished. No distress.  Cardiovascular: Normal rate, regular rhythm and normal heart sounds.  Pulmonary/Chest: Effort normal and breath sounds normal.  Musculoskeletal: Normal range of motion.  Neurological: She is alert and oriented to person, place, and time. No cranial nerve deficit.  Skin:  5 small areas of discoloration to the right lower extremity starting at the ankle and progressing upward.  Areas are flat and round no redness or erythema.  No drainage areas measure approximately 0.5 to 0.75 cm in diameter.      Assessment & Plan:  Insect bites to right lower extremity 1.  Mupirocin ointment applied to affected  areas twice daily for 10 days may stop if area seem to improve before the 10-day period. 2.  Avoid itching or scratching areas.  Keep areas clean and dry. 3.  Patient instructed to return if worsening of symptoms, change in appearance of insect bites, fever, pain or swelling to lower extremity, or other concerns. Patient verbalizes understanding and has no questions at time of discharge. Meds ordered this encounter  Medications  . DISCONTD: mupirocin cream (BACTROBAN) 2 %    Sig: Apply 1 application topically 2 (two) times daily for 10 days. Apply to right ankle, lower leg twice daily for 10 days.    Dispense:  22 g    Refill:  0    Order Specific Question:   Supervising Provider    Answer:   Stacie GlazeJENKINS, JOHN E [[id number]]  . mupirocin ointment (BACTROBAN) 2 %    Sig: Place 1 application into the nose 2 (two) times daily for 10 days. Apply to affected area twice daily for 10 days or until symptoms improve.    Dispense:  22 g    Refill:  0    Order Specific Question:   Supervising Provider    Answer:   Stacie GlazeJENKINS, JOHN E [phone][[id number]]

## 2017-09-05 NOTE — Patient Instructions (Signed)
Insect Bite, Adult An insect bite can make your skin red, itchy, and swollen. An insect bite is different from an insect sting, which happens when an insect injects poison (venom) into the skin. Some insects can spread disease to people through a bite. However, most insect bites do not lead to disease and are not serious. What are the causes? Insects may bite for a variety of reasons, including:  Hunger.  To defend themselves.  Insects that bite include:  Spiders.  Mosquitoes.  Ticks.  Fleas.  Ants.  Flies.  Bedbugs.  What are the signs or symptoms? Symptoms of this condition include:  Itching or pain in the bite area.  Redness and swelling in the bite area.  An open wound (skin ulcer).  In many cases, symptoms last for 2-4 days. How is this diagnosed? This condition is usually diagnosed based on symptoms and a physical exam. How is this treated? Treatment is usually not needed. Symptoms often go away on their own. When treatment is recommended, it may involve:  Applying a cream or lotion to the bitten area. This treatment helps with itching.  Taking an antibiotic medicine. This treatment is needed if the bite area gets infected.  Getting a tetanus shot.  Applying ice to the affected area.  Medicines called antihistamines. This treatment is needed if you develop an allergic reaction to the insect bite.  Follow these instructions at home: Bite area care  Do not scratch the bite area.  Keep the bite area clean and dry. Wash it every day with soap and water as told by your health care provider.  Check the bite area every day for signs of infection. Check for: ? More redness, swelling, or pain. ? Fluid or blood. ? Warmth. ? Pus. Managing pain, itching, and swelling   You may apply a baking soda paste, cortisone cream, or calamine lotion to the bite area as told by your health care provider.  If directed, applyice to the bite area. ? Put ice in a  plastic bag. ? Place a towel between your skin and the bag. ? Leave the ice on for 20 minutes, 2-3 times per day. Medicines  Apply or take over-the-counter and prescription medicines only as told by your health care provider.  If you were prescribed an antibiotic medicine, use it as told by your health care provider. Do not stop using the antibiotic even if your condition improves. General instructions  Keep all follow-up visits as told by your health care provider. This is important. How is this prevented? To help reduce your risk of insect bites:  When you are outdoors, wear clothing that covers your arms and legs.  Use insect repellent. The best insect repellents contain: ? DEET, picaridin, oil of lemon eucalyptus (OLE), or IR3535. ? Higher amounts of an active ingredient.  If your home windows do not have screens, consider installing them.  Contact a health care provider if:  You have more redness, swelling, or pain in the bite area.  You have fluid, blood, or pus coming from the bite area.  The bite area feels warm to the touch.  You have a fever. Get help right away if:  You have joint pain.  You have a rash.  You have shortness of breath.  You feel unusually tired or sleepy.  You have neck pain.  You have a headache.  You have unusual weakness.  You have chest pain.  You have nausea, vomiting, or pain in the abdomen. This   information is not intended to replace advice given to you by your health care provider. Make sure you discuss any questions you have with your health care provider. Document Released: 06/20/2004 Document Revised: 01/10/2016 Document Reviewed: 11/20/2015 Elsevier Interactive Patient Education  2018 Elsevier Inc.  

## 2017-09-17 ENCOUNTER — Telehealth: Payer: Self-pay

## 2017-11-19 MED FILL — LARIN 21 1-20 TABLET: 1-20 | 63 days supply | Qty: 63 | Fill #1

## 2017-12-01 ENCOUNTER — Encounter: Payer: Self-pay | Admitting: Obstetrics & Gynecology

## 2017-12-01 ENCOUNTER — Encounter: Payer: Self-pay | Admitting: Radiology

## 2017-12-01 ENCOUNTER — Telehealth: Payer: 59 | Admitting: Family

## 2017-12-01 DIAGNOSIS — R112 Nausea with vomiting, unspecified: Secondary | ICD-10-CM

## 2017-12-01 NOTE — Progress Notes (Signed)
Based on what you shared with me it looks like you have a condition that should be evaluated in a face to face office visit.  NOTE: If you entered your credit card information for this eVisit, you will not be charged. You may see a "hold" on your card for the $30 but that hold will drop off and you will not have a charge processed.  If you are having a true medical emergency please call 911.  If you need an urgent face to face visit, [facility] has four urgent care centers for your convenience.  If you need care fast and have a high deductible or no insurance consider:   [email or web address] to reserve your spot online an avoid wait times  [facility] [address], [address] [place], [place] [address] 8 am to 8 pm Monday-Friday 10 am to 4 pm Saturday-Sunday *Across the street from [employer]  [facility]  [address] [place], [address] 8 am to 5 pm Monday-Friday * In the [facility]   The following sites will take your  insurance:  . [facility]  [phone] Get Driving Directions Find a Provider at this Location  [address] , [place] [address] . 10 am to 8 pm Monday-Friday . 12 pm to 8 pm Saturday-Sunday   . [facility]  [phone] Get Driving Directions Find a Provider at this Location  [address], [address] [place], [place] [address] . 8 am to 8 pm Monday-Friday . 9 am to 6 pm Saturday . 11 am to 6 pm Sunday   . [facility]  [phone] Get Driving Directions  [address] [place], [place] [address] . 8 am to 8 pm Monday-Friday . 8 am to 4 pm Saturday-Sunday   Your e-visit answers were reviewed by a board certified advanced clinical practitioner to complete your personal care plan.  Thank you for using e-Visits. 

## 2017-12-03 ENCOUNTER — Encounter: Payer: Self-pay | Admitting: Family Medicine

## 2017-12-03 ENCOUNTER — Encounter: Payer: Self-pay | Admitting: Emergency Medicine

## 2017-12-03 ENCOUNTER — Ambulatory Visit (INDEPENDENT_AMBULATORY_CARE_PROVIDER_SITE_OTHER): Payer: Self-pay | Admitting: Family Medicine

## 2017-12-03 ENCOUNTER — Ambulatory Visit
Admission: EM | Admit: 2017-12-03 | Discharge: 2017-12-03 | Disposition: A | Discharge status: Home / Self Care | Payer: 59 | Attending: Family Medicine | Admitting: Family Medicine

## 2017-12-03 ENCOUNTER — Other Ambulatory Visit: Payer: Self-pay

## 2017-12-03 VITALS — BP 150/90 | HR 88 | Temp 99.7°F | Wt 200.0 lb

## 2017-12-03 DIAGNOSIS — R51 Headache: Secondary | ICD-10-CM

## 2017-12-03 DIAGNOSIS — G43909 Migraine, unspecified, not intractable, without status migrainosus: Secondary | ICD-10-CM

## 2017-12-03 DIAGNOSIS — R110 Nausea: Secondary | ICD-10-CM

## 2017-12-03 DIAGNOSIS — R030 Elevated blood-pressure reading, without diagnosis of hypertension: Secondary | ICD-10-CM

## 2017-12-03 DIAGNOSIS — R112 Nausea with vomiting, unspecified: Secondary | ICD-10-CM

## 2017-12-03 DIAGNOSIS — R81 Glycosuria: Secondary | ICD-10-CM

## 2017-12-03 LAB — COMPREHENSIVE METABOLIC PANEL
ALBUMIN: 4.1 g/dL (ref 3.5–5.0)
ALT: 32 U/L (ref 0–44)
AST: 29 U/L (ref 15–41)
Alkaline Phosphatase: 71 U/L (ref 38–126)
Anion gap: 13 (ref 5–15)
BUN: 8 mg/dL (ref 6–20)
CO2: 20 mmol/L — AB (ref 22–32)
CREATININE: 0.77 mg/dL (ref 0.44–1.00)
Calcium: 9.1 mg/dL (ref 8.9–10.3)
Chloride: 105 mmol/L (ref 98–111)
GFR calc Af Amer: >60 mL/min (ref >60)
GFR calc non Af Amer: >60 mL/min (ref >60)
GLUCOSE: 101 mg/dL — AB (ref 70–99)
POTASSIUM: 3.5 mmol/L (ref 3.5–5.1)
SODIUM: 138 mmol/L (ref 135–145)
Total Bilirubin: 0.7 mg/dL (ref 0.3–1.2)
Total Protein: 8.1 g/dL (ref 6.5–8.1)

## 2017-12-03 LAB — URINALYSIS, ROUTINE W REFLEX MICROSCOPIC
GLUCOSE, UA: NEGATIVE mg/dL
Ketones, ur: 15 mg/dL — AB
Nitrite: NEGATIVE
Protein, ur: 30 mg/dL — AB
SPECIFIC GRAVITY, URINE: 1.025 (ref 1.005–1.030)
pH: 6 (ref 5.0–8.0)

## 2017-12-03 LAB — POCT URINALYSIS DIPSTICK
GLUCOSE UA: POSITIVE — AB
Nitrite, UA: NEGATIVE
Protein, UA: POSITIVE — AB
Spec Grav, UA: >=1.03 — AB (ref 1.010–1.025)
Urobilinogen, UA: 1 E.U./dL
pH, UA: 6 (ref 5.0–8.0)

## 2017-12-03 LAB — URINALYSIS, MICROSCOPIC (REFLEX)

## 2017-12-03 LAB — HCG, QUANTITATIVE, PREGNANCY: hCG, Beta Chain, Quant, S: <1 m[IU]/mL (ref <5)

## 2017-12-03 LAB — POCT URINE PREGNANCY: Preg Test, Ur: NEGATIVE

## 2017-12-03 MED ORDER — ONDANSETRON 4 MG PO TBDP: 4.0000 mg | ORAL_TABLET | Freq: Three times a day (TID) | ORAL | 0 refills | Status: DC | PRN

## 2017-12-03 MED ORDER — SUMATRIPTAN SUCCINATE 50 MG PO TABS: 50.0000 mg | ORAL_TABLET | Freq: Once | ORAL | 0 refills | Status: DC

## 2017-12-03 NOTE — Progress Notes (Signed)
Patient ID: Veronica Foster Eldridge, female    DOB: 09/16/1985, 32 y.o.   MRN: [id number]  PCP: Doreene Nestlark, Katherine K, NP  Chief Complaint  Patient presents with  . choice-ha/vomitting    Subjective:  HPI Veronica Foster Hovanec is a 32 y.o. female presents for evaluation of nausea and vomiting x 5 days. Medical problems significant for PCOS, prediabetes, migraines, obesity, and oShe initially thought she may be pregnant as she had missed several doses of her OCP and remained sexually active without barrier protection. She has since "doubled up" on her oral contraceptives and has developed spotting. She has not missed a period although concerned for pregnancy as she has been unable to keep food or much fluid down over the course of 5 days. She also has experienced a mild headache for which she hasn't attempted relief with any medication. On arrival, her BP was elevated. Denies any prior history of diagnosis of hypertension. Denies abdominal pain, chills, fever, or dysuria. She is concern as she will be traveling out to the country next week. Social History   Socioeconomic History  . Marital status: Single    Spouse name: Not on file  . Number of children: Not on file  . Years of education: Not on file  . Highest education level: Not on file  Occupational History  . Not on file  Social Needs  . Financial resource strain: Not on file  . Food insecurity:    Worry: Not on file    Inability: Not on file  . Transportation needs:    Medical: Not on file    Non-medical: Not on file  Tobacco Use  . Smoking status: Never Smoker  . Smokeless tobacco: Never Used  Substance and Sexual Activity  . Alcohol use: Yes    Alcohol/week: 0.0 oz    Comment: social  . Drug use: No  . Sexual activity: Yes    Birth control/protection: Pill  Lifestyle  . Physical activity:    Days per week: Not on file    Minutes per session: Not on file  . Stress: Not on file  Relationships  . Social connections:    Talks on phone:  Not on file    Gets together: Not on file    Attends religious service: Not on file    Active member of club or organization: Not on file    Attends meetings of clubs or organizations: Not on file    Relationship status: Not on file  . Intimate partner violence:    Fear of current or ex partner: Not on file    Emotionally abused: Not on file    Physically abused: Not on file    Forced sexual activity: Not on file  Other Topics Concern  . Not on file  Social History Narrative   Single.   Highest level of education is Masters.   Works for [employer] in [employer].   Enjoys traveling, reading, wine tasting.    Family History  Problem Relation Age of Onset  . Cancer Mother        colon  . Stroke Mother   . Hypertension Mother   . Arthritis Paternal Aunt   . Hypertension Maternal Grandmother   . Cancer Maternal Grandfather        prostate/pancreatic  . Hypertension Maternal Grandfather   . Arthritis Paternal Grandmother   . Hypertension Paternal Grandmother   . Cancer Paternal Grandfather        prostate  . Heart disease Paternal  Grandfather   . Stroke Paternal Grandfather   . Hypertension Paternal Grandfather    Review of Systems Pertinent negatives listed in HPI  Patient Active Problem List   Diagnosis Date Noted  . Iron deficiency anemia 08/08/2017  . Diarrhea 01/31/2017  . PCOS (polycystic ovarian syndrome) 12/30/2014  . Prediabetes 12/08/2014  . Preventative health care 12/08/2014  . Obesity (BMI 35.0-39.9 without comorbidity) 11/10/2014  . Migraines 11/10/2014    No Known Allergies  Prior to Admission medications   Medication Sig Start Date End Date Taking? Authorizing Provider  ferrous sulfate 325 (65 FE) MG tablet Take 325 mg by mouth 2 (two) times a week.   Yes [provider]  norethindrone-ethinyl estradiol (JUNEL 1/20) 1-20 MG-MCG tablet Take 1 tablet by mouth daily. 08/26/17  Yes Anyanwu, Jethro Bastos, MD    Past Medical, Surgical Family  and Social History reviewed and updated.    Objective:   Today's Vitals   12/03/17 1406  SpO2: 99%  Weight: 200 lb (90.7 kg)    Wt Readings from Last 3 Encounters:  12/03/17 200 lb (90.7 kg)  09/05/17 200 lb 12.8 oz (91.1 kg)  08/08/17 202 lb 12.8 oz (92 kg)   Physical Exam  Constitutional: She is oriented to person, place, and time.  Cardiovascular: Normal rate, regular rhythm, normal heart sounds and intact distal pulses.  Pulmonary/Chest: Effort normal and breath sounds normal.  Abdominal: Soft. She exhibits no shifting dullness, no distension, no pulsatile liver, no fluid wave, no abdominal bruit, no pulsatile midline mass and no mass. Bowel sounds are decreased. There is no tenderness. There is no tenderness at McBurney's point and negative Murphy's sign.  Surgical history of cholecystectomy   Neurological: She is alert and oriented to person, place, and time.  Psychiatric: She has a normal mood and affect. Her behavior is normal. Judgment and thought content normal.  Nursing note and vitals reviewed.    Assessment & Plan:  1. Nausea, unknown cause. Pregnancy negative. UA is positive for glucose and patient has a history of prediabetes and PCOS.  Concern for possible hyperglycemia. No monitor on site today to measure glucose. Recommended follow-up at an urgent care or walk-in facility that has labs to ensure hyperglycemia is not the cause of persistent nausea with vomiting.   -A short course of Zofran prescribed to manage symptoms of nausea only   2. Elevated BP without diagnosis of hypertension -urine positive for protein. Recommended PCP follow-up to ensure HA is not related to possible underlying hypertension.   3. Glucose found in urine on examination -hx of prediabetes. Last A1C 5.6 in March. -recommended follow-up at urgent care or ED to have evaluated if unable to get in with PCP    Meds ordered this encounter  Medications  . ondansetron (ZOFRAN ODT) 4 MG  disintegrating tablet    Sig: Take 1 tablet (4 mg total) by mouth every 8 (eight) hours as needed for nausea or vomiting.    Dispense:  14 tablet    Refill:  0   If symptoms worsen or do not improve, return for follow-up, follow-up with PCP, or at the emergency department if severity of symptoms warrant a higher level of care.     Godfrey Pick. Tiburcio Pea, MSN, FNP-C [facility]  [address]  [place], [place] [address] [phone]

## 2017-12-03 NOTE — ED Provider Notes (Signed)
[facility]    CSN: [id number] Arrival date & time: 12/03/17  1515  History   Chief Complaint Chief Complaint  Patient presents with  . Headache  . Nausea  . Emesis   HPI  32 year old female presents with the above complaints.  Patient reports a 4-day history of intermittent headache, nausea, vomiting.  She has a history of migraine headache.  She states that it is more mild than her previous migraine headaches but she has not had one in years.  She went to [facility] for evaluation.  Her urinalysis was positive for protein and glucose.  Additionally, there was concern that she may be pregnant despite a urine pregnancy test that was negative.  She was discharged with Zofran and encouraged to come to urgent care for evaluation.  Patient states that she had a recent menstrual cycle but it was not a normal cycle.  She has missed some doses of her oral contraceptive.  She is concerned about pregnancy.  She states that she has a diffuse headache.  She states it is all over.  Associated nausea and vomiting.  Associated photophobia.  Has taken Tylenol with some relief but no resolution.  No other associated symptoms.  No other complaints.  Past Medical History:  Diagnosis Date  . Chicken pox   . GERD (gastroesophageal reflux disease)   . History of UTI   . Migraine   . Murmur   . PCOS (polycystic ovarian syndrome) 12/30/2014    Patient Active Problem List   Diagnosis Date Noted  . Iron deficiency anemia 08/08/2017  . Diarrhea 01/31/2017  . PCOS (polycystic ovarian syndrome) 12/30/2014  . Prediabetes 12/08/2014  . Preventative health care 12/08/2014  . Obesity (BMI 35.0-39.9 without comorbidity) 11/10/2014  . Migraines 11/10/2014    Past Surgical History:  Procedure Laterality Date  . CHOLECYSTECTOMY OPEN  2014    OB History    Gravida  0   Para  0   Term  0   Preterm  0   AB  0   Living  0     SAB  0   TAB  0   Ectopic  0   Multiple  0   Live  Births  0            Home Medications    Prior to Admission medications   Medication Sig Start Date End Date Taking? Authorizing Provider  ferrous sulfate 325 (65 FE) MG tablet Take 325 mg by mouth 2 (two) times a week.   Yes [provider]  norethindrone-ethinyl estradiol (JUNEL 1/20) 1-20 MG-MCG tablet Take 1 tablet by mouth daily. 08/26/17  Yes Anyanwu, Jethro Bastos, MD  ondansetron (ZOFRAN ODT) 4 MG disintegrating tablet Take 1 tablet (4 mg total) by mouth every 8 (eight) hours as needed for nausea or vomiting. 12/03/17   Bing Neighbors, FNP  SUMAtriptan (IMITREX) 50 MG tablet Take 1 tablet (50 mg total) by mouth once for 1 dose. May repeat in 2 hours if headache persists or recurs. 12/03/17 12/03/17  Tommie Sams, DO    Family History Family History  Problem Relation Age of Onset  . Cancer Mother        colon  . Stroke Mother   . Hypertension Mother   . Arthritis Paternal Aunt   . Hypertension Maternal Grandmother   . Cancer Maternal Grandfather        prostate/pancreatic  . Hypertension Maternal Grandfather   . Arthritis Paternal Grandmother   .  Hypertension Paternal Grandmother   . Cancer Paternal Grandfather        prostate  . Heart disease Paternal Grandfather   . Stroke Paternal Grandfather   . Hypertension Paternal Grandfather     Social History Social History   Tobacco Use  . Smoking status: Never Smoker  . Smokeless tobacco: Never Used  Substance Use Topics  . Alcohol use: Yes    Alcohol/week: 0.0 oz    Comment: social  . Drug use: No     Allergies   Patient has no known allergies.   Review of Systems Review of Systems  Constitutional: Negative.   Gastrointestinal: Positive for nausea and vomiting.  Neurological: Positive for headaches.   Physical Exam Triage Vital Signs ED Triage Vitals  Enc Vitals Group     BP 12/03/17 1527 (!) 159/98     Pulse Rate 12/03/17 1527 100     Resp 12/03/17 1527 16     Temp 12/03/17 1527 98.5 F  (36.9 C)     Temp Source 12/03/17 1527 Oral     SpO2 12/03/17 1527 100 %     Weight 12/03/17 1528 200 lb (90.7 kg)     Height 12/03/17 1528 5\' 3"  (1.6 m)     Head Circumference --      Peak Flow --      Pain Score 12/03/17 1528 3     Pain Loc --      Pain Edu? --      Excl. in GC? --    Updated Vital Signs BP (!) 159/98 (BP Location: Left Arm)   Pulse 100   Temp 98.5 F (36.9 C) (Oral)   Resp 16   Ht 5\' 3"  (1.6 m)   Wt 200 lb (90.7 kg)   LMP 11/13/2017   SpO2 100%   BMI 35.43 kg/m   Visual Acuity Right Eye Distance:   Left Eye Distance:   Bilateral Distance:    Right Eye Near:   Left Eye Near:    Bilateral Near:     Physical Exam  Constitutional: She is oriented to person, place, and time. She appears well-developed. No distress.  HENT:  Head: Normocephalic and atraumatic.  Eyes: Pupils are equal, round, and reactive to light. Right eye exhibits no discharge. Left eye exhibits no discharge.  Cardiovascular: Normal rate and regular rhythm.  Pulmonary/Chest: Effort normal and breath sounds normal. She has no wheezes. She has no rales.  Neurological: She is alert and oriented to person, place, and time.  Psychiatric: She has a normal mood and affect. Her behavior is normal.  Vitals reviewed.  UC Treatments / Results  Labs (all labs ordered are listed, but only abnormal results are displayed) Labs Reviewed  COMPREHENSIVE METABOLIC PANEL - Abnormal; Notable for the following components:      Result Value   CO2 20 (*)    Glucose, Bld 101 (*)    All other components within normal limits  URINALYSIS, ROUTINE W REFLEX MICROSCOPIC - Abnormal; Notable for the following components:   APPearance HAZY (*)    Hgb urine dipstick SMALL (*)    Bilirubin Urine MODERATE (*)    Ketones, ur 15 (*)    Protein, ur 30 (*)    Leukocytes, UA SMALL (*)    All other components within normal limits  URINALYSIS, MICROSCOPIC (REFLEX) - Abnormal; Notable for the following components:    Bacteria, UA FEW (*)    All other components within normal limits  URINE CULTURE  HCG, QUANTITATIVE, PREGNANCY    EKG None  Radiology No results found.  Procedures Procedures (including critical care time)  Medications Ordered in UC Medications - No data to display  Initial Impression / Assessment and Plan / UC Course  I have reviewed the triage vital signs and the nursing notes.  Pertinent labs & imaging results that were available during my care of the patient were reviewed by me and considered in my medical decision making (see chart for details).    32 year old female presents with migraine headache.  Her recent urinalysis which was abnormal, a repeat urinalysis was done.  It revealed moderate bilirubin, small hemoglobin, ketones and protein.  Patient did not give a clean sample as there were 6-10 squamous epithelials on microscopy.  No pyuria.  6-10 RBCs.  She also had mucus and calcium oxalate crystals.  Sending culture.  Serum pregnancy test was negative.  Advised panel unremarkable.  Advised Zofran and aggressive hydration.  Imitrex for migraine.  Supportive care.  Final Clinical Impressions(s) / UC Diagnoses   Final diagnoses:  Migraine without status migrainosus, not intractable, unspecified migraine type     Discharge Instructions     Rest.  Zofran as needed.  Imitrex for migraine.   Lots of fluids.  Take care  Dr. Adriana Simasook    ED Prescriptions    Medication Sig Dispense Auth. Provider   SUMAtriptan (IMITREX) 50 MG tablet Take 1 tablet (50 mg total) by mouth once for 1 dose. May repeat in 2 hours if headache persists or recurs. 10 tablet Tommie Samsook, Kaileah Shevchenko G, DO     Controlled Substance Prescriptions [place] Controlled Substance Registry consulted? Not Applicable   Tommie SamsCook, Bianca Raneri G, DO 12/03/17 1657

## 2017-12-03 NOTE — Discharge Instructions (Signed)
Rest.  Zofran as needed.  Imitrex for migraine.   Lots of fluids.  Take care  Dr. Adriana Simasook

## 2017-12-03 NOTE — ED Triage Notes (Signed)
Patient in today c/o headache, nausea & vomiting x 4 days. Patient was seen at [facility] today and given Zofran, which she has not taken and was told to follow up with Urgent Care. Patient has taken Tylenol and that has helped some.

## 2017-12-03 NOTE — Patient Instructions (Addendum)
I recommend that you follow-up at either [facility] [phone])  or [facility] [phone] for further evaluation of all of your symptoms which have not improved over the last 5 days. I have prescribed Zofran for your nausea. Please take as directed. For headaches, I recommend attempt relief with Tylenol and avoid NSAIDS.  Please follow-up on your blood pressure and abnormal urine results.      Nausea, Adult Feeling sick to your stomach (nausea) means that your stomach is upset or you feel like you have to throw up (vomit). Feeling sick to your stomach is usually not serious, but it may be an early sign of a more serious medical problem. As you feel sicker to your stomach, it can lead to throwing up (vomiting). If you throw up, or if you are not able to drink enough fluids, there is a risk of dehydration. Dehydration can make you feel tired and thirsty, have a dry mouth, and pee (urinate) less often. Older adults and people who have other diseases or a weak defense (immune) system have a higher risk of dehydration. The main goal of treating this condition is to:  Limit how often you feel sick to your stomach.  Prevent throwing up and dehydration.  Follow these instructions at home: Follow instructions from your doctor about how to care for yourself at home. Eating and drinking Follow these recommendations as told by your doctor:  Take an oral rehydration solution (ORS). This is a drink that is sold at pharmacies and stores.  Drink clear fluids in small amounts as you are able, such as: ? Water. ? Ice chips. ? Fruit juice that has water added (diluted fruit juice). ? Low-calorie sports drinks.  Eat bland, easy to digest foods in small amounts as you are able, such as: ? Bananas. ? Applesauce. ? Rice. ? Lean meats. ? Toast. ? Crackers.  Avoid drinking fluids that contain a lot of sugar or caffeine.  Avoid alcohol.  Avoid spicy or fatty  foods.  General instructions  Drink enough fluid to keep your pee (urine) clear or pale yellow.  Wash your hands often. If you cannot use soap and water, use hand sanitizer.  Make sure that all people in your household wash their hands well and often.  Rest at home while you get better.  Take over-the-counter and prescription medicines only as told by your doctor.  Breathe slowly and deeply when you feel sick to your stomach.  Watch your condition for any changes.  Keep all follow-up visits as told by your doctor. This is important. Contact a doctor if:  You have a headache.  You have new symptoms.  You feel sicker to your stomach.  You have a fever.  You feel light-headed or dizzy.  You throw up.  You are not able to keep fluids down. Get help right away if:  You have pain in your chest, neck, arm, or jaw.  You feel very weak or you pass out (faint).  You have throw up that is bright red or looks like coffee grounds.  You have bloody or black poop (stools), or poop that looks like tar.  You have a very bad headache, a stiff neck, or both.  You have very bad pain, cramping, or bloating in your belly.  You have a rash.  You have trouble breathing or you are breathing very quickly.  Your heart is beating very quickly.  Your skin feels cold and clammy.  You  feel confused.  You have pain while peeing.  You have signs of dehydration, such as: ? Dark pee, or very little or no pee. ? Cracked lips. ? Dry mouth. ? Sunken eyes. ? Sleepiness. ? Weakness. These symptoms may be an emergency. Do not wait to see if the symptoms will go away. Get medical help right away. Call your local emergency services (911 in the [place] Do not drive yourself to the hospital. This information is not intended to replace advice given to you by your health care provider. Make sure you discuss any questions you have with your health care provider. Document Released: 05/02/2011  Document Revised: 10/19/2015 Document Reviewed: 01/17/2015 Elsevier Interactive Patient Education  [employer]2018 Elsevier Inc.

## 2017-12-05 LAB — URINE CULTURE: Culture: <10000 — AB

## 2017-12-08 ENCOUNTER — Telehealth: Payer: Self-pay | Admitting: Emergency Medicine

## 2017-12-08 NOTE — Telephone Encounter (Signed)
Left message follow up call from [facility] visit 

## 2017-12-11 ENCOUNTER — Ambulatory Visit: Payer: 59 | Admitting: Obstetrics & Gynecology

## 2017-12-22 ENCOUNTER — Encounter: Payer: Self-pay | Admitting: Obstetrics & Gynecology

## 2017-12-22 ENCOUNTER — Ambulatory Visit (INDEPENDENT_AMBULATORY_CARE_PROVIDER_SITE_OTHER): Payer: 59 | Admitting: Obstetrics & Gynecology

## 2017-12-22 VITALS — BP 153/90 | HR 105 | Ht 62.0 in | Wt 195.4 lb

## 2017-12-22 DIAGNOSIS — Z3042 Encounter for surveillance of injectable contraceptive: Secondary | ICD-10-CM

## 2017-12-22 DIAGNOSIS — Z30013 Encounter for initial prescription of injectable contraceptive: Secondary | ICD-10-CM | POA: Diagnosis not present

## 2017-12-22 DIAGNOSIS — Z3009 Encounter for other general counseling and advice on contraception: Secondary | ICD-10-CM

## 2017-12-22 MED ORDER — MEDROXYPROGESTERONE ACETATE 150 MG/ML IM SUSP
150.0000 mg | INTRAMUSCULAR | Status: DC
  Administered 2017-12-22 – 2018-03-20 (×2): 150 mg via INTRAMUSCULAR

## 2017-12-22 MED ORDER — MEDROXYPROGESTERONE ACETATE 150 MG/ML IM SUSP: 150.0000 mg | INTRAMUSCULAR | 0 refills | Status: DC

## 2017-12-22 NOTE — Patient Instructions (Signed)
Return to clinic for any scheduled appointments or for any gynecologic concerns as needed.   

## 2017-12-22 NOTE — Progress Notes (Signed)
   GYNECOLOGY OFFICE VISIT NOTE  History:  32 y.o. G0P0000 here today for discussion of possible OCP side effects. Has elevated BP and increased migraines; has been on same 20 mcg estradiol pill for 4 years. Worried about this. She denies any abnormal vaginal discharge, bleeding, pelvic pain or other concerns.   Past Medical History:  Diagnosis Date  . Chicken pox   . GERD (gastroesophageal reflux disease)   . History of UTI   . Migraine   . Murmur   . PCOS (polycystic ovarian syndrome) 12/30/2014    Past Surgical History:  Procedure Laterality Date  . CHOLECYSTECTOMY OPEN  2014   The following portions of the patient's history were reviewed and updated as appropriate: allergies, current medications, past family history, past medical history, past social history, past surgical history and problem list.   Health Maintenance:  Normal pap on 06/27/2016.    Review of Systems:  Pertinent items noted in HPI and remainder of comprehensive ROS otherwise negative.  Objective:  Physical Exam BP (!) 153/90   Pulse (!) 105   Ht 5\' 2"  (1.575 m)   Wt 195 lb 6.4 oz (88.6 kg)   LMP 12/12/2017   BMI 35.74 kg/m  CONSTITUTIONAL: Well-developed, well-nourished female in no acute distress.  HEENT:  Normocephalic, atraumatic. External right and left ear normal. No scleral icterus.  NECK: Normal range of motion, supple, no masses noted on observation SKIN: Skin is warm and dry. No rash noted. Not diaphoretic. No erythema. No pallor. MUSCULOSKELETAL: Normal range of motion. No edema noted. NEUROLOGIC: Alert and oriented to person, place, and time. Normal muscle tone coordination. No cranial nerve deficit noted. PSYCHIATRIC: Normal mood and affect. Normal behavior. Normal judgment and thought content. CARDIOVASCULAR: Normal heart rate noted RESPIRATORY: Effort and breath sounds normal, no problems with respiration noted ABDOMEN: Soft, no distention noted.   PELVIC: Deferred   Assessment & Plan:    1. Initiation of Depo Provera Elevated BP on low dose OCPs; will be discontinued. Needs workup for migraines.  Progestin only methods discussed; recommended LARCs. She wanted Depo Provera, cautioned about appetite stimulation and weight gain.  - medroxyPROGESTERone (DEPO-PROVERA) injection 150 mg given today.  Routine preventative health maintenance measures emphasized. Please refer to After Visit Summary for other counseling recommendations.   Return in about 3 months (around 03/24/2018) for Depo Provera.   Total face-to-face time with patient: 15 minutes.  Over 50% of encounter was spent on counseling and coordination of care.   Jaynie CollinsUGONNA  Jonmichael Beadnell, MD, FACOG Obstetrician & Gynecologist, [facility], [facility]

## 2018-01-05 DIAGNOSIS — F4323 Adjustment disorder with mixed anxiety and depressed mood: Secondary | ICD-10-CM | POA: Diagnosis not present

## 2018-01-09 DIAGNOSIS — F4323 Adjustment disorder with mixed anxiety and depressed mood: Secondary | ICD-10-CM | POA: Diagnosis not present

## 2018-01-16 DIAGNOSIS — F4323 Adjustment disorder with mixed anxiety and depressed mood: Secondary | ICD-10-CM | POA: Diagnosis not present

## 2018-01-23 DIAGNOSIS — F4323 Adjustment disorder with mixed anxiety and depressed mood: Secondary | ICD-10-CM | POA: Diagnosis not present

## 2018-01-30 DIAGNOSIS — F4323 Adjustment disorder with mixed anxiety and depressed mood: Secondary | ICD-10-CM | POA: Diagnosis not present

## 2018-02-06 DIAGNOSIS — F4323 Adjustment disorder with mixed anxiety and depressed mood: Secondary | ICD-10-CM | POA: Diagnosis not present

## 2018-02-13 ENCOUNTER — Ambulatory Visit: Payer: 59 | Admitting: Primary Care

## 2018-02-13 ENCOUNTER — Encounter: Payer: Self-pay | Admitting: Primary Care

## 2018-02-13 VITALS — BP 160/100 | HR 73 | Temp 98.6°F | Ht 62.0 in | Wt 198.5 lb

## 2018-02-13 DIAGNOSIS — G479 Sleep disorder, unspecified: Secondary | ICD-10-CM

## 2018-02-13 DIAGNOSIS — F4323 Adjustment disorder with mixed anxiety and depressed mood: Secondary | ICD-10-CM | POA: Diagnosis not present

## 2018-02-13 DIAGNOSIS — I10 Essential (primary) hypertension: Secondary | ICD-10-CM | POA: Insufficient documentation

## 2018-02-13 DIAGNOSIS — G43001 Migraine without aura, not intractable, with status migrainosus: Secondary | ICD-10-CM | POA: Diagnosis not present

## 2018-02-13 MED ORDER — RIZATRIPTAN BENZOATE 5 MG PO TABS: ORAL_TABLET | ORAL | 0 refills | Status: DC

## 2018-02-13 MED ORDER — AMLODIPINE BESYLATE 10 MG PO TABS: ORAL_TABLET | ORAL | 0 refills | Status: DC

## 2018-02-13 MED FILL — RIZATRIPTAN 5 MG TABLET: 5 | 30 days supply | Qty: 10 | Fill #0

## 2018-02-13 MED FILL — AMLODIPINE BESYLATE 10 MG T: 10 | 30 days supply | Qty: 30 | Fill #0

## 2018-02-13 NOTE — Patient Instructions (Addendum)
Start amlodipine 10 mg tablets for high blood pressure. Take 1 tablet by mouth once daily.   Try to check your blood pressure once daily if possible. Use the same arm and check at the same time of day.  For migraines try using rizatriptan (Maxalt) 5 mg. Take 1 tablet at migraine onset. May repeat in 2 hours. Do not exceed 2 tablets in 24 hours.  Continue exercising. You should be getting 150 minutes of moderate intensity exercise weekly.  Schedule a follow up visit with me in 2-3 weeks for blood pressure check.  It was a pleasure to see you today!

## 2018-02-13 NOTE — Assessment & Plan Note (Addendum)
Increased over the last several months. Could be secondary to uncontrolled hypertension. Will treat hypertension and follow up with her in 2-3 weeks.   She didn't like the way she felt on sumatriptan, will switch to Maxalt. She will update.

## 2018-02-13 NOTE — Assessment & Plan Note (Signed)
Three documented elevated BP readings over the last several months, also family history. Could be contributing to headaches.   Rx for Amlodipine 10 mg sent to pharmacy. Will have her monitor home BP readings and follow up in 2-3 weeks for re-evaluation.

## 2018-02-13 NOTE — Assessment & Plan Note (Signed)
Suspect this is secondary to increased stress, could be secondary to uncontrolled hypertension. Discussed use of Melatonin OTC. Will treat BP.   Follow up in 2-3 weeks.

## 2018-02-13 NOTE — Progress Notes (Signed)
Subjective:    Patient ID: Veronica Foster, female    DOB: 1985-09-22, 32 y.o.   MRN: [id number]  HPI  Veronica Foster is a 32 year old female with a history of PCOS, prediabetes, migraines who presents today with multiple complaints.  1) Elevated Blood Pressure: Elevated blood pressure readings at several health facilities. Evaluated at Urgent Care in July 2019 for severe migraine, treated with Imitrex. Her GYN switched her from OCP's to Depo Provera to reduce BP and headaches.   She has a family history of hypertension in her mother. She's never been treated with medication in the past.  BP Readings from Last 3 Encounters:  02/13/18 (!) 160/100  12/22/17 (!) 153/90  12/03/17 (!) 159/98   2) Sleep Disturbance: She is having difficulty falling and staying asleep that has been problematic for the last 2-3 months. She lives with her boyfriend who has a child that will visit. She tries to read and turn off technology before bed when her boyfriend's child is there but finds that she watches TV when he is not there.   It takes her around 1 hour to fall asleep, then she will wake up 3-4 hours later, will fall back asleep within 30 minutes. She's been taking Valeran Root which will help her to fall asleep but she'll still wake 4-5 hours later and cannot fall back asleep.  She endorses a lot of stress over the last 3-4 months and is now meeting with a therapist. She is also exercising. She is not drinking caffeine during the day. Her boyfriend denies that she snores. She denies feeling tired during the day, has enough energy during the day.   Review of Systems  Eyes: Negative for visual disturbance.  Respiratory: Negative for shortness of breath.   Cardiovascular: Negative for chest pain.  Neurological: Positive for headaches. Negative for dizziness.  Psychiatric/Behavioral: Positive for sleep disturbance.       Increased stress over the last several months.        Past Medical History:    Diagnosis Date  . Chicken pox   . GERD (gastroesophageal reflux disease)   . History of UTI   . Migraine   . Murmur   . PCOS (polycystic ovarian syndrome) 12/30/2014     Social History   Socioeconomic History  . Marital status: Single    Spouse name: Not on file  . Number of children: Not on file  . Years of education: Not on file  . Highest education level: Not on file  Occupational History  . Not on file  Social Needs  . Financial resource strain: Not on file  . Food insecurity:    Worry: Not on file    Inability: Not on file  . Transportation needs:    Medical: Not on file    Non-medical: Not on file  Tobacco Use  . Smoking status: Never Smoker  . Smokeless tobacco: Never Used  Substance and Sexual Activity  . Alcohol use: Yes    Alcohol/week: 0.0 standard drinks    Comment: social  . Drug use: No  . Sexual activity: Yes    Birth control/protection: Pill  Lifestyle  . Physical activity:    Days per week: Not on file    Minutes per session: Not on file  . Stress: Not on file  Relationships  . Social connections:    Talks on phone: Not on file    Gets together: Not on file    Attends religious service:  Not on file    Active member of club or organization: Not on file    Attends meetings of clubs or organizations: Not on file    Relationship status: Not on file  . Intimate partner violence:    Fear of current or ex partner: Not on file    Emotionally abused: Not on file    Physically abused: Not on file    Forced sexual activity: Not on file  Other Topics Concern  . Not on file  Social History Narrative   Single.   Highest level of education is Masters.   Works for [employer] in [employer].   Enjoys traveling, reading, wine tasting.    Past Surgical History:  Procedure Laterality Date  . CHOLECYSTECTOMY OPEN  2014    Family History  Problem Relation Age of Onset  . Cancer Mother        colon  . Stroke Mother   . Hypertension Mother   .  Arthritis Paternal Aunt   . Hypertension Maternal Grandmother   . Cancer Maternal Grandfather        prostate/pancreatic  . Hypertension Maternal Grandfather   . Arthritis Paternal Grandmother   . Hypertension Paternal Grandmother   . Cancer Paternal Grandfather        prostate  . Heart disease Paternal Grandfather   . Stroke Paternal Grandfather   . Hypertension Paternal Grandfather     No Known Allergies  Current Outpatient Medications on File Prior to Visit  Medication Sig Dispense Refill  . ferrous sulfate 325 (65 FE) MG tablet Take 325 mg by mouth 2 (two) times a week.    . medroxyPROGESTERone (DEPO-PROVERA) 150 MG/ML injection Inject 1 mL (150 mg total) into the muscle every 3 (three) months. 1 mL 0  . ondansetron (ZOFRAN ODT) 4 MG disintegrating tablet Take 1 tablet (4 mg total) by mouth every 8 (eight) hours as needed for nausea or vomiting. (Patient not taking: Reported on 02/13/2018) 14 tablet 0   Current Facility-Administered Medications on File Prior to Visit  Medication Dose Route Frequency Provider Last Rate Last Dose  . medroxyPROGESTERone (DEPO-PROVERA) injection 150 mg  150 mg Intramuscular Q90 days Anyanwu, Ugonna A, MD   150 mg at 12/22/17 1532    BP (!) 160/100   Pulse 73   Temp 98.6 F (37 C) (Oral)   Ht 5\' 2"  (1.575 m)   Wt 198 lb 8 oz (90 kg)   SpO2 98%   BMI 36.31 kg/m    Objective:   Physical Exam  Constitutional: She appears well-nourished.  Neck: Neck supple.  Cardiovascular: Normal rate and regular rhythm.  Respiratory: Effort normal and breath sounds normal.  Skin: Skin is warm and dry.  Psychiatric: She has a normal mood and affect.           Assessment & Plan:

## 2018-02-20 DIAGNOSIS — F4323 Adjustment disorder with mixed anxiety and depressed mood: Secondary | ICD-10-CM | POA: Diagnosis not present

## 2018-02-27 DIAGNOSIS — F4323 Adjustment disorder with mixed anxiety and depressed mood: Secondary | ICD-10-CM | POA: Diagnosis not present

## 2018-03-06 ENCOUNTER — Encounter: Payer: Self-pay | Admitting: Primary Care

## 2018-03-06 ENCOUNTER — Ambulatory Visit: Payer: 59 | Admitting: Primary Care

## 2018-03-06 DIAGNOSIS — I10 Essential (primary) hypertension: Secondary | ICD-10-CM

## 2018-03-06 DIAGNOSIS — F4323 Adjustment disorder with mixed anxiety and depressed mood: Secondary | ICD-10-CM | POA: Diagnosis not present

## 2018-03-06 MED ORDER — AMLODIPINE BESYLATE 10 MG PO TABS: ORAL_TABLET | ORAL | 1 refills | Status: DC

## 2018-03-06 NOTE — Assessment & Plan Note (Signed)
Improved on Amlodipine 10 mg, not yet at goal. She is motivated to work on lifestyle changes and has already joined a running club,commended her on this. Will allow her 3 months to work on lifestyle changes. We will see her back at that time.

## 2018-03-06 NOTE — Patient Instructions (Signed)
Continue taking Amlodipine 10 mg once daily for blood pressure.   Continue exercising. You should be getting 150 minutes of moderate intensity exercise weekly.  Continue to work on improving your diet.  Please schedule a follow up appointment in 3 months for blood pressure check.  It was a pleasure to see you today!

## 2018-03-06 NOTE — Progress Notes (Signed)
Subjective:    Patient ID: Veronica Foster, female    DOB: 1985-10-21, 32 y.o.   MRN: [id number]  HPI  Veronica Foster is a 32 year old female who presents today for follow up of hypertension.   She was last evaluated in late September with elevated blood pressure readings at several health facilities. She has a strong family history of hypertension in her mother, she herself has never been treated. Given persistently elevated readings she was initiated on Amlodipine 10 mg tablets.   Since her last visit she's not checking her blood pressure. She denies headaches, chest pain, dizziness. She is compliant to her amlodipine at bedtime, is sleeping better. She has recently joined a running club and is running 2-3 times weekly. Her goal is to run a half marathon next year.  BP Readings from Last 3 Encounters:  03/06/18 (!) 146/82  02/13/18 (!) 160/100  12/22/17 (!) 153/90   Wt Readings from Last 3 Encounters:  03/06/18 202 lb 8 oz (91.9 kg)  02/13/18 198 lb 8 oz (90 kg)  12/22/17 195 lb 6.4 oz (88.6 kg)     Review of Systems  Respiratory: Negative for shortness of breath.   Cardiovascular: Negative for chest pain.  Neurological: Negative for dizziness and headaches.       Past Medical History:  Diagnosis Date  . Chicken pox   . GERD (gastroesophageal reflux disease)   . History of UTI   . Migraine   . Murmur   . PCOS (polycystic ovarian syndrome) 12/30/2014     Social History   Socioeconomic History  . Marital status: Single    Spouse name: Not on file  . Number of children: Not on file  . Years of education: Not on file  . Highest education level: Not on file  Occupational History  . Not on file  Social Needs  . Financial resource strain: Not on file  . Food insecurity:    Worry: Not on file    Inability: Not on file  . Transportation needs:    Medical: Not on file    Non-medical: Not on file  Tobacco Use  . Smoking status: Never Smoker  . Smokeless tobacco: Never  Used  Substance and Sexual Activity  . Alcohol use: Yes    Alcohol/week: 0.0 standard drinks    Comment: social  . Drug use: No  . Sexual activity: Yes    Birth control/protection: Pill  Lifestyle  . Physical activity:    Days per week: Not on file    Minutes per session: Not on file  . Stress: Not on file  Relationships  . Social connections:    Talks on phone: Not on file    Gets together: Not on file    Attends religious service: Not on file    Active member of club or organization: Not on file    Attends meetings of clubs or organizations: Not on file    Relationship status: Not on file  . Intimate partner violence:    Fear of current or ex partner: Not on file    Emotionally abused: Not on file    Physically abused: Not on file    Forced sexual activity: Not on file  Other Topics Concern  . Not on file  Social History Narrative   Single.   Highest level of education is Masters.   Works for [employer] in [employer].   Enjoys traveling, reading, wine tasting.    Past Surgical  History:  Procedure Laterality Date  . CHOLECYSTECTOMY OPEN  2014    Family History  Problem Relation Age of Onset  . Cancer Mother        colon  . Stroke Mother   . Hypertension Mother   . Arthritis Paternal Aunt   . Hypertension Maternal Grandmother   . Cancer Maternal Grandfather        prostate/pancreatic  . Hypertension Maternal Grandfather   . Arthritis Paternal Grandmother   . Hypertension Paternal Grandmother   . Cancer Paternal Grandfather        prostate  . Heart disease Paternal Grandfather   . Stroke Paternal Grandfather   . Hypertension Paternal Grandfather     No Known Allergies  Current Outpatient Medications on File Prior to Visit  Medication Sig Dispense Refill  . ferrous sulfate 325 (65 FE) MG tablet Take 325 mg by mouth 2 (two) times a week.    . medroxyPROGESTERone (DEPO-PROVERA) 150 MG/ML injection Inject 1 mL (150 mg total) into the muscle every 3  (three) months. 1 mL 0  . rizatriptan (MAXALT) 5 MG tablet Take 1 tablet by mouth at migraine onset. May repeat in 2 hours if needed. Do not exceed 2 tablets in 24 hours. 10 tablet 0  . ondansetron (ZOFRAN ODT) 4 MG disintegrating tablet Take 1 tablet (4 mg total) by mouth every 8 (eight) hours as needed for nausea or vomiting. (Patient not taking: Reported on 02/13/2018) 14 tablet 0   Current Facility-Administered Medications on File Prior to Visit  Medication Dose Route Frequency Provider Last Rate Last Dose  . medroxyPROGESTERone (DEPO-PROVERA) injection 150 mg  150 mg Intramuscular Q90 days Anyanwu, Ugonna A, MD   150 mg at 12/22/17 1532    BP (!) 146/82   Pulse 97   Temp 98.4 F (36.9 C) (Oral)   Ht 5\' 2"  (1.575 m)   Wt 202 lb 8 oz (91.9 kg)   SpO2 98%   BMI 37.04 kg/m    Objective:   Physical Exam  Constitutional: She appears well-nourished.  Neck: Neck supple.  Cardiovascular: Normal rate and regular rhythm.  Respiratory: Effort normal and breath sounds normal.  Skin: Skin is warm and dry.           Assessment & Plan:

## 2018-03-10 MED FILL — AMLODIPINE BESYLATE 10 MG T: 10 | 90 days supply | Qty: 90 | Fill #0

## 2018-03-13 DIAGNOSIS — F4323 Adjustment disorder with mixed anxiety and depressed mood: Secondary | ICD-10-CM | POA: Diagnosis not present

## 2018-03-17 ENCOUNTER — Other Ambulatory Visit: Payer: Self-pay | Admitting: *Deleted

## 2018-03-17 MED ORDER — MEDROXYPROGESTERONE ACETATE 150 MG/ML IM SUSP: 150.0000 mg | INTRAMUSCULAR | 4 refills | Status: DC

## 2018-03-17 MED FILL — medroxyPROGESTERone ACETATE: 150 | 84 days supply | Qty: 1 | Fill #0

## 2018-03-17 NOTE — Telephone Encounter (Signed)
Pt requesting Depo Provera refill sent to pharmacy. Refills sent to [facility]

## 2018-03-20 ENCOUNTER — Ambulatory Visit (INDEPENDENT_AMBULATORY_CARE_PROVIDER_SITE_OTHER): Payer: 59 | Admitting: *Deleted

## 2018-03-20 VITALS — BP 128/84 | HR 94

## 2018-03-20 DIAGNOSIS — Z30013 Encounter for initial prescription of injectable contraceptive: Secondary | ICD-10-CM

## 2018-03-20 DIAGNOSIS — F4323 Adjustment disorder with mixed anxiety and depressed mood: Secondary | ICD-10-CM | POA: Diagnosis not present

## 2018-03-20 DIAGNOSIS — Z3042 Encounter for surveillance of injectable contraceptive: Secondary | ICD-10-CM

## 2018-03-20 NOTE — Progress Notes (Signed)
I have reviewed the chart and agree with nursing staff's documentation of this patient's encounter.  Jaynie Collins, MD 03/20/2018 1:12 PM

## 2018-03-20 NOTE — Progress Notes (Signed)
Date last pap 06/27/2016. Last Depo-Provera: 12/22/2017. Side Effects if any: none. Serum HCG indicated? NA. Depo-Provera 150 mg IM given [id number] [employer], [employer] fundraiser. Next appointment due 06/05/2018-06/19/2018.

## 2018-03-27 DIAGNOSIS — F4323 Adjustment disorder with mixed anxiety and depressed mood: Secondary | ICD-10-CM | POA: Diagnosis not present

## 2018-04-10 DIAGNOSIS — F4323 Adjustment disorder with mixed anxiety and depressed mood: Secondary | ICD-10-CM | POA: Diagnosis not present

## 2018-04-17 DIAGNOSIS — F4323 Adjustment disorder with mixed anxiety and depressed mood: Secondary | ICD-10-CM | POA: Diagnosis not present

## 2018-04-24 DIAGNOSIS — F4323 Adjustment disorder with mixed anxiety and depressed mood: Secondary | ICD-10-CM | POA: Diagnosis not present

## 2018-05-01 DIAGNOSIS — F4323 Adjustment disorder with mixed anxiety and depressed mood: Secondary | ICD-10-CM | POA: Diagnosis not present

## 2018-05-08 DIAGNOSIS — F4323 Adjustment disorder with mixed anxiety and depressed mood: Secondary | ICD-10-CM | POA: Diagnosis not present

## 2018-05-15 DIAGNOSIS — F4323 Adjustment disorder with mixed anxiety and depressed mood: Secondary | ICD-10-CM | POA: Diagnosis not present

## 2018-05-29 DIAGNOSIS — F4323 Adjustment disorder with mixed anxiety and depressed mood: Secondary | ICD-10-CM | POA: Diagnosis not present

## 2018-06-01 ENCOUNTER — Telehealth: Payer: 59 | Admitting: Family Medicine

## 2018-06-01 ENCOUNTER — Encounter: Payer: Self-pay | Admitting: Family Medicine

## 2018-06-01 DIAGNOSIS — J111 Influenza due to unidentified influenza virus with other respiratory manifestations: Secondary | ICD-10-CM

## 2018-06-01 DIAGNOSIS — R6889 Other general symptoms and signs: Secondary | ICD-10-CM | POA: Diagnosis not present

## 2018-06-01 MED ORDER — OSELTAMIVIR PHOSPHATE 75 MG PO CAPS: 75.0000 mg | ORAL_CAPSULE | Freq: Two times a day (BID) | ORAL | 0 refills | Status: AC

## 2018-06-01 MED FILL — AMLODIPINE BESYLATE 10 MG T: 10 | 90 days supply | Qty: 90 | Fill #1

## 2018-06-01 NOTE — Progress Notes (Signed)

## 2018-06-03 MED FILL — medroxyPROGESTERone ACETATE: 150 | 84 days supply | Qty: 1 | Fill #1

## 2018-06-05 ENCOUNTER — Ambulatory Visit: Payer: 59

## 2018-06-05 VITALS — BP 137/84 | HR 74

## 2018-06-05 DIAGNOSIS — Z3042 Encounter for surveillance of injectable contraceptive: Secondary | ICD-10-CM

## 2018-06-05 MED ORDER — MEDROXYPROGESTERONE ACETATE 150 MG/ML IM SUSP: 150.0000 mg | Freq: Once | INTRAMUSCULAR | Status: DC

## 2018-06-05 NOTE — Progress Notes (Signed)
Patient presented to the office for depo-provera injection received in right arm deltoid IM. [facility] #[id number] Patient would like to make this her last injection. She would like to try and conceive in the next year.

## 2018-06-08 DIAGNOSIS — F4323 Adjustment disorder with mixed anxiety and depressed mood: Secondary | ICD-10-CM | POA: Diagnosis not present

## 2018-06-12 ENCOUNTER — Ambulatory Visit: Payer: 59 | Admitting: Primary Care

## 2018-06-15 DIAGNOSIS — F4323 Adjustment disorder with mixed anxiety and depressed mood: Secondary | ICD-10-CM | POA: Diagnosis not present

## 2018-06-22 DIAGNOSIS — F4323 Adjustment disorder with mixed anxiety and depressed mood: Secondary | ICD-10-CM | POA: Diagnosis not present

## 2018-06-29 DIAGNOSIS — F4323 Adjustment disorder with mixed anxiety and depressed mood: Secondary | ICD-10-CM | POA: Diagnosis not present

## 2018-07-06 DIAGNOSIS — F4323 Adjustment disorder with mixed anxiety and depressed mood: Secondary | ICD-10-CM | POA: Diagnosis not present

## 2018-07-09 ENCOUNTER — Ambulatory Visit: Payer: 59 | Admitting: Primary Care

## 2018-07-09 ENCOUNTER — Encounter: Payer: Self-pay | Admitting: Primary Care

## 2018-07-09 VITALS — BP 122/76 | HR 90 | Temp 98.5°F | Ht 62.0 in | Wt 197.5 lb

## 2018-07-09 DIAGNOSIS — I10 Essential (primary) hypertension: Secondary | ICD-10-CM

## 2018-07-09 DIAGNOSIS — E282 Polycystic ovarian syndrome: Secondary | ICD-10-CM | POA: Diagnosis not present

## 2018-07-09 LAB — POCT GLYCOSYLATED HEMOGLOBIN (HGB A1C): HEMOGLOBIN A1C: 5.8 % — AB (ref 4.0–5.6)

## 2018-07-09 NOTE — Progress Notes (Signed)
Subjective:    Patient ID: Veronica Foster, female    DOB: 09/24/1985, 33 y.o.   MRN: [id number]  HPI  [name] MacadamiaKenney is a 33 year old female who presents today for follow up of hypertension. She is also aspiring to become pregnant, received her last depo provera injection in January 2020. She is due to see her GYN next week.   She was last evaluated on 03/06/18 for follow up of hypertension. Her BP had improved on Amlodipine 10 mg but wasn't quite at goal. She wanted some time to work on lifestyle changes so she was asked to return today.  BP Readings from Last 3 Encounters:  07/09/18 122/76  06/05/18 137/84  03/20/18 128/84   Since her last visit she is working to improve her diet by cooking more, using fresh vegetables. She has limited pastas and eating out. She is doing yoga 1-4 times weekly on average. She denies chest pain, dizziness, ankle edema. She has had a few headaches/migraines and attributes these to stress/lack of sleep.  Wt Readings from Last 3 Encounters:  07/09/18 197 lb 8 oz (89.6 kg)  03/06/18 202 lb 8 oz (91.9 kg)  02/13/18 198 lb 8 oz (90 kg)     Review of Systems  Eyes: Negative for visual disturbance.  Respiratory: Negative for shortness of breath.   Cardiovascular: Negative for chest pain and leg swelling.  Neurological: Negative for dizziness.       Past Medical History:  Diagnosis Date  . Chicken pox   . GERD (gastroesophageal reflux disease)   . History of UTI   . Migraine   . Murmur   . PCOS (polycystic ovarian syndrome) 12/30/2014     Social History   Socioeconomic History  . Marital status: Single    Spouse name: Not on file  . Number of children: Not on file  . Years of education: Not on file  . Highest education level: Not on file  Occupational History  . Not on file  Social Needs  . Financial resource strain: Not on file  . Food insecurity:    Worry: Not on file    Inability: Not on file  . Transportation needs:    Medical: Not on  file    Non-medical: Not on file  Tobacco Use  . Smoking status: Never Smoker  . Smokeless tobacco: Never Used  Substance and Sexual Activity  . Alcohol use: Yes    Alcohol/week: 0.0 standard drinks    Comment: social  . Drug use: No  . Sexual activity: Yes    Birth control/protection: Pill  Lifestyle  . Physical activity:    Days per week: Not on file    Minutes per session: Not on file  . Stress: Not on file  Relationships  . Social connections:    Talks on phone: Not on file    Gets together: Not on file    Attends religious service: Not on file    Active member of club or organization: Not on file    Attends meetings of clubs or organizations: Not on file    Relationship status: Not on file  . Intimate partner violence:    Fear of current or ex partner: Not on file    Emotionally abused: Not on file    Physically abused: Not on file    Forced sexual activity: Not on file  Other Topics Concern  . Not on file  Social History Narrative   Single.   Highest  level of education is Masters.   Works [employer].   Enjoys traveling, reading, wine tasting.    Past Surgical History:  Procedure Laterality Date  . CHOLECYSTECTOMY OPEN  2014    Family History  Problem Relation Age of Onset  . Cancer Mother        colon  . Stroke Mother   . Hypertension Mother   . Arthritis Paternal Aunt   . Hypertension Maternal Grandmother   . Cancer Maternal Grandfather        prostate/pancreatic  . Hypertension Maternal Grandfather   . Arthritis Paternal Grandmother   . Hypertension Paternal Grandmother   . Cancer Paternal Grandfather        prostate  . Heart disease Paternal Grandfather   . Stroke Paternal Grandfather   . Hypertension Paternal Grandfather     No Known Allergies  Current Outpatient Medications on File Prior to Visit  Medication Sig Dispense Refill  . amLODipine (NORVASC) 10 MG tablet Take 1 tablet by mouth once daily for blood  pressure. 90 tablet 1  . ferrous sulfate 325 (65 FE) MG tablet Take 325 mg by mouth 2 (two) times a week.    . ondansetron (ZOFRAN ODT) 4 MG disintegrating tablet Take 1 tablet (4 mg total) by mouth every 8 (eight) hours as needed for nausea or vomiting. 14 tablet 0  . rizatriptan (MAXALT) 5 MG tablet Take 1 tablet by mouth at migraine onset. May repeat in 2 hours if needed. Do not exceed 2 tablets in 24 hours. 10 tablet 0   Current Facility-Administered Medications on File Prior to Visit  Medication Dose Route Frequency Provider Last Rate Last Dose  . medroxyPROGESTERone (DEPO-PROVERA) injection 150 mg  150 mg Intramuscular Q90 days Anyanwu, Ugonna A, MD   150 mg at 03/20/18 0821  . medroxyPROGESTERone (DEPO-PROVERA) injection 150 mg  150 mg Intramuscular Once Anyanwu, Ugonna A, MD        BP 122/76   Pulse 90   Temp 98.5 F (36.9 C) (Oral)   Ht 5\' 2"  (1.575 m)   Wt 197 lb 8 oz (89.6 kg)   SpO2 98%   BMI 36.12 kg/m    Objective:   Physical Exam  Constitutional: She appears well-nourished.  Neck: Neck supple.  Cardiovascular: Normal rate and regular rhythm.  Respiratory: Effort normal and breath sounds normal.  Skin: Skin is warm and dry.  Psychiatric: She has a normal mood and affect.           Assessment & Plan:

## 2018-07-09 NOTE — Assessment & Plan Note (Signed)
Repeat A1C pending. Encouraged to continue to work on lifestyle changes for weight loss.

## 2018-07-09 NOTE — Patient Instructions (Signed)
Stop by the lab prior to leaving today. I will notify you of your results once received.   Continue taking Amlodipine 10 mg for blood pressure.  Start exercising. You should be getting 150 minutes of moderate intensity exercise weekly.  It's important to improve your diet by reducing consumption of fast food, fried food, processed snack foods, sugary drinks. Increase consumption of fresh vegetables and fruits, whole grains, water.  Ensure you are drinking 64 ounces of water daily.  It was a pleasure to see you today!

## 2018-07-09 NOTE — Assessment & Plan Note (Signed)
Improved and at goal on Amlodipine. Commended her on lifestyle changes, encouraged her to continue. Continue Amlodipine 10 mg for now.

## 2018-07-09 NOTE — Progress Notes (Signed)
GYNECOLOGY ANNUAL PREVENTATIVE CARE ENCOUNTER NOTE  Subjective:   Veronica Foster is a 33 y.o. G0P0000 female here for a routine annual gynecologic exam.  Current complaints: none.   Denies abnormal vaginal bleeding, discharge, pelvic pain, problems with intercourse or other gynecologic concerns.  Reports that she will attempt conception soon, took last dose of Depo Provera in January.    Gynecologic History No LMP recorded. (Menstrual status: Other). Contraception: Depo-Provera injections Last Pap: 06/27/2016. Results were: normal with negative HPV  Obstetric History OB History  Gravida Para Term Preterm AB Living  0 0 0 0 0 0  SAB TAB Ectopic Multiple Live Births  0 0 0 0 0    Past Medical History:  Diagnosis Date  . Chicken pox   . GERD (gastroesophageal reflux disease)   . History of UTI   . Migraine   . Murmur   . PCOS (polycystic ovarian syndrome) 12/30/2014    Past Surgical History:  Procedure Laterality Date  . CHOLECYSTECTOMY OPEN  2014    Current Outpatient Medications on File Prior to Visit  Medication Sig Dispense Refill  . amLODipine (NORVASC) 10 MG tablet Take 1 tablet by mouth once daily for blood pressure. 90 tablet 1  . ferrous sulfate 325 (65 FE) MG tablet Take 325 mg by mouth 2 (two) times a week.    . rizatriptan (MAXALT) 5 MG tablet Take 1 tablet by mouth at migraine onset. May repeat in 2 hours if needed. Do not exceed 2 tablets in 24 hours. 10 tablet 0  . ondansetron (ZOFRAN ODT) 4 MG disintegrating tablet Take 1 tablet (4 mg total) by mouth every 8 (eight) hours as needed for nausea or vomiting. (Patient not taking: Reported on 07/14/2018) 14 tablet 0   Current Facility-Administered Medications on File Prior to Visit  Medication Dose Route Frequency Provider Last Rate Last Dose  . medroxyPROGESTERone (DEPO-PROVERA) injection 150 mg  150 mg Intramuscular Q90 days Saliyah Gillin A, MD   150 mg at 03/20/18 0821  . medroxyPROGESTERone (DEPO-PROVERA)  injection 150 mg  150 mg Intramuscular Once Deniya Craigo A, MD        No Known Allergies  Social History:  reports that she has never smoked. She has never used smokeless tobacco. She reports current alcohol use. She reports that she does not use drugs.  Family History  Problem Relation Age of Onset  . Cancer Mother        colon  . Stroke Mother   . Hypertension Mother   . Arthritis Paternal Aunt   . Hypertension Maternal Grandmother   . Cancer Maternal Grandfather        prostate/pancreatic  . Hypertension Maternal Grandfather   . Arthritis Paternal Grandmother   . Hypertension Paternal Grandmother   . Cancer Paternal Grandfather        prostate  . Heart disease Paternal Grandfather   . Stroke Paternal Grandfather   . Hypertension Paternal Grandfather     The following portions of the patient's history were reviewed and updated as appropriate: allergies, current medications, past family history, past medical history, past social history, past surgical history and problem list.  Review of Systems Pertinent items noted in HPI and remainder of comprehensive ROS otherwise negative.   Objective:  BP 120/79   Pulse 98   Ht 5\' 3"  (1.6 m)   Wt 197 lb (89.4 kg)   BMI 34.90 kg/m  CONSTITUTIONAL: Well-developed, well-nourished female in no acute distress.  HENT:  Normocephalic,  atraumatic, External right and left ear normal. Oropharynx is clear and moist EYES: Conjunctivae and EOM are normal. Pupils are equal, round, and reactive to light. No scleral icterus.  NECK: Normal range of motion, supple, no masses.  Normal thyroid.  SKIN: Skin is warm and dry. No rash noted. Not diaphoretic. No erythema. No pallor. MUSCULOSKELETAL: Normal range of motion. No tenderness.  No cyanosis, clubbing, or edema.  2+ distal pulses. NEUROLOGIC: Alert and oriented to person, place, and time. Normal reflexes, muscle tone coordination. No cranial nerve deficit noted. PSYCHIATRIC: Normal mood and  affect. Normal behavior. Normal judgment and thought content. CARDIOVASCULAR: Normal heart rate noted, regular rhythm RESPIRATORY: Clear to auscultation bilaterally. Effort and breath sounds normal, no problems with respiration noted. BREASTS: Symmetric in size. No masses, skin changes, nipple drainage, or lymphadenopathy. ABDOMEN: Soft, normal bowel sounds, no distention noted.  No tenderness, rebound or guarding.  PELVIC: Normal appearing external genitalia; normal appearing vaginal mucosa and cervix.  No abnormal discharge noted.  Pap smear obtained.  Normal uterine size, no other palpable masses, no uterine or adnexal tenderness.   Assessment and Plan:  1. Encounter for preconception consultation Talked about avoidance of teratogens and to take multivitamins with adequate folic acid. Also encouraged exercise and weight loss, may help restore ovulatory cycles given history of PCOS.  May have delayed cycles s/p Depo Provera, if no pregnancy by end of the year, may return to discuss staring Metformin +/- ovulation induction agents.   2. Well woman exam with routine gynecological exam - Cytology - PAP Will follow up results of pap smear and manage accordingly. Routine preventative health maintenance measures emphasized. Please refer to After Visit Summary for other counseling recommendations.    Jaynie CollinsUGONNA  Tomeeka Plaugher, MD, FACOG Obstetrician & Gynecologist, [facility], [facility]

## 2018-07-13 DIAGNOSIS — F4323 Adjustment disorder with mixed anxiety and depressed mood: Secondary | ICD-10-CM | POA: Diagnosis not present

## 2018-07-14 ENCOUNTER — Ambulatory Visit (INDEPENDENT_AMBULATORY_CARE_PROVIDER_SITE_OTHER): Payer: 59 | Admitting: Obstetrics & Gynecology

## 2018-07-14 ENCOUNTER — Encounter: Payer: Self-pay | Admitting: Obstetrics & Gynecology

## 2018-07-14 VITALS — BP 120/79 | HR 98 | Ht 63.0 in | Wt 197.0 lb

## 2018-07-14 DIAGNOSIS — Z3169 Encounter for other general counseling and advice on procreation: Secondary | ICD-10-CM

## 2018-07-14 DIAGNOSIS — Z124 Encounter for screening for malignant neoplasm of cervix: Secondary | ICD-10-CM

## 2018-07-14 DIAGNOSIS — Z1151 Encounter for screening for human papillomavirus (HPV): Secondary | ICD-10-CM | POA: Diagnosis not present

## 2018-07-14 DIAGNOSIS — Z01419 Encounter for gynecological examination (general) (routine) without abnormal findings: Secondary | ICD-10-CM | POA: Diagnosis not present

## 2018-07-14 NOTE — Patient Instructions (Signed)
Preventive Care 18-39 Years, Female Preventive care refers to lifestyle choices and visits with your health care provider that can promote health and wellness. What does preventive care include?   A yearly physical exam. This is also called an annual well check.  Dental exams once or twice a year.  Routine eye exams. Ask your health care provider how often you should have your eyes checked.  Personal lifestyle choices, including: ? Daily care of your teeth and gums. ? Regular physical activity. ? Eating a healthy diet. ? Avoiding tobacco and drug use. ? Limiting alcohol use. ? Practicing safe sex. ? Taking vitamin and mineral supplements as recommended by your health care provider. What happens during an annual well check? The services and screenings done by your health care provider during your annual well check will depend on your age, overall health, lifestyle risk factors, and family history of disease. Counseling Your health care provider may ask you questions about your:  Alcohol use.  Tobacco use.  Drug use.  Emotional well-being.  Home and relationship well-being.  Sexual activity.  Eating habits.  Work and work environment.  Method of birth control.  Menstrual cycle.  Pregnancy history. Screening You may have the following tests or measurements:  Height, weight, and BMI.  Diabetes screening. This is done by checking your blood sugar (glucose) after you have not eaten for a while (fasting).  Blood pressure.  Lipid and cholesterol levels. These may be checked every 5 years starting at age 20.  Skin check.  Hepatitis C blood test.  Hepatitis B blood test.  Sexually transmitted disease (STD) testing.  BRCA-related cancer screening. This may be done if you have a family history of breast, ovarian, tubal, or peritoneal cancers.  Pelvic exam and Pap test. This may be done every 3 years starting at age 21. Starting at age 30, this may be done every 5  years if you have a Pap test in combination with an HPV test. Discuss your test results, treatment options, and if necessary, the need for more tests with your health care provider. Vaccines Your health care provider may recommend certain vaccines, such as:  Influenza vaccine. This is recommended every year.  Tetanus, diphtheria, and acellular pertussis (Tdap, Td) vaccine. You may need a Td booster every 10 years.  Varicella vaccine. You may need this if you have not been vaccinated.  HPV vaccine. If you are 26 or younger, you may need three doses over 6 months.  Measles, mumps, and rubella (MMR) vaccine. You may need at least one dose of MMR. You may also need a second dose.  Pneumococcal 13-valent conjugate (PCV13) vaccine. You may need this if you have certain conditions and were not previously vaccinated.  Pneumococcal polysaccharide (PPSV23) vaccine. You may need one or two doses if you smoke cigarettes or if you have certain conditions.  Meningococcal vaccine. One dose is recommended if you are age 19-21 years and a first-year college student living in a residence hall, or if you have one of several medical conditions. You may also need additional booster doses.  Hepatitis A vaccine. You may need this if you have certain conditions or if you travel or work in places where you may be exposed to hepatitis A.  Hepatitis B vaccine. You may need this if you have certain conditions or if you travel or work in places where you may be exposed to hepatitis B.  Haemophilus influenzae type b (Hib) vaccine. You may need this if you   have certain risk factors. Talk to your health care provider about which screenings and vaccines you need and how often you need them. This information is not intended to replace advice given to you by your health care provider. Make sure you discuss any questions you have with your health care provider. Document Released: 07/09/2001 Document Revised: 12/24/2016  Document Reviewed: 03/14/2015 Elsevier Interactive Patient Education  2019 [employer].

## 2018-07-20 ENCOUNTER — Telehealth: Payer: 59 | Admitting: Nurse Practitioner

## 2018-07-20 DIAGNOSIS — F4323 Adjustment disorder with mixed anxiety and depressed mood: Secondary | ICD-10-CM | POA: Diagnosis not present

## 2018-07-20 DIAGNOSIS — A09 Infectious gastroenteritis and colitis, unspecified: Secondary | ICD-10-CM

## 2018-07-20 LAB — CYTOLOGY - PAP
DIAGNOSIS: NEGATIVE
HPV (WINDOPATH): NOT DETECTED

## 2018-07-20 MED ORDER — CIPROFLOXACIN HCL 500 MG PO TABS: 500.0000 mg | ORAL_TABLET | Freq: Two times a day (BID) | ORAL | 0 refills | Status: DC

## 2018-07-20 NOTE — Progress Notes (Signed)
We are sorry that you are not feeling well.  Here is how we plan to help!  Based on what you have shared with me it looks like you have Acute Infectious Diarrhea.  Most cases of acute diarrhea are due to infections with virus and bacteria and are self-limited conditions lasting less than 14 days.  For your symptoms you may take Imodium 2 mg tablets that are over the counter at your local pharmacy. Take two tablet now and then one after each loose stool up to 6 a day.  Antibiotics are not needed for most people with diarrhea.    Optional: I have sent in Cipro 500 mg two tablets twice a day for five days.  HOME CARE  We recommend changing your diet to help with your symptoms for the next few days.  Drink plenty of fluids that contain water salt and sugar. Sports drinks such as Gatorade may help.   You may try broths, soups, bananas, applesauce, soft breads, mashed potatoes or crackers.   You are considered infectious for as long as the diarrhea continues. Hand washing or use of alcohol based hand sanitizers is recommend.  It is best to stay out of work or school until your symptoms stop.   GET HELP RIGHT AWAY  If you have dark yellow colored urine or do not pass urine frequently you should drink more fluids.    If your symptoms worsen   If you feel like you are going to pass out (faint)  You have a new problem  MAKE SURE YOU   Understand these instructions.  Will watch your condition.  Will get help right away if you are not doing well or get worse.  Your e-visit answers were reviewed by a board certified advanced clinical practitioner to complete your personal care plan.  Depending on the condition, your plan could have included both over the counter or prescription medications.  If there is a problem please reply  once you have received a response from your provider.  Your safety is important to us.  If you have drug allergies check your prescription carefully.    You  can use MyChart to ask questions about today's visit, request a non-urgent call back, or ask for a work or school excuse for 24 hours related to this e-Visit. If it has been greater than 24 hours you will need to follow up with your provider, or enter a new e-Visit to address those concerns.   You will get an e-mail in the next two days asking about your experience.  I hope that your e-visit has been valuable and will speed your recovery. Thank you for using e-visits.  5 minutes spent reviewing and documenting in chart.  

## 2018-07-22 ENCOUNTER — Other Ambulatory Visit: Payer: Self-pay | Admitting: Primary Care

## 2018-07-27 DIAGNOSIS — F4323 Adjustment disorder with mixed anxiety and depressed mood: Secondary | ICD-10-CM | POA: Diagnosis not present

## 2018-07-31 IMAGING — US US PELVIS COMPLETE TRANSABD/TRANSVAG
1 series · 14 of 25 positions shown · non-contrast
Comparison: None

CLINICAL DATA: Dysmenorrhea for 3 months

EXAM:
TRANSABDOMINAL AND TRANSVAGINAL ULTRASOUND OF PELVIS
TECHNIQUE: Both transabdominal and transvaginal ultrasound examinations of the
pelvis were performed. Transabdominal technique was performed for
global imaging of the pelvis including uterus, ovaries, adnexal
regions, and pelvic cul-de-sac. It was necessary to proceed with
endovaginal exam following the transabdominal exam to visualize the
endometrium and ovaries.

[Series 1: us pelvis complete transabd/transvag · 0.11mm/px · 14 of 49 slices shown]
[im 1/49]
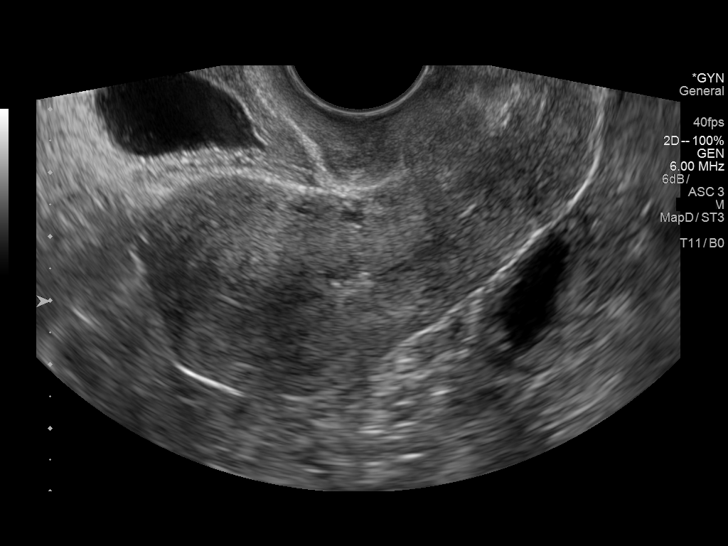
[im 5/49]
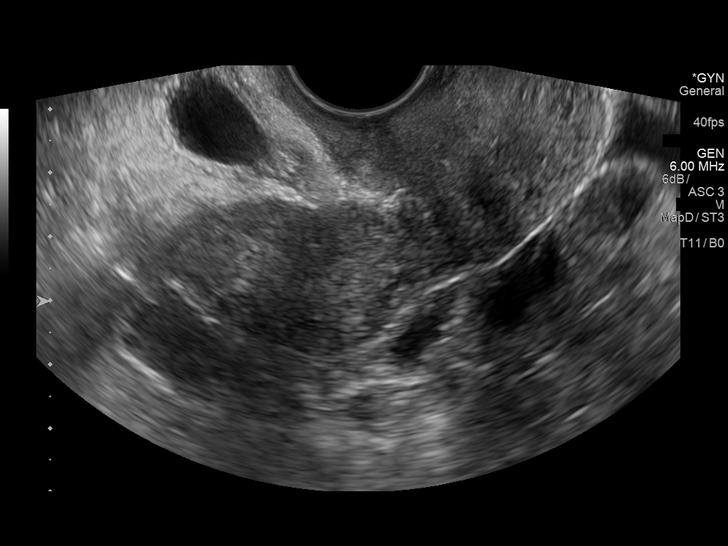
[im 9/49]
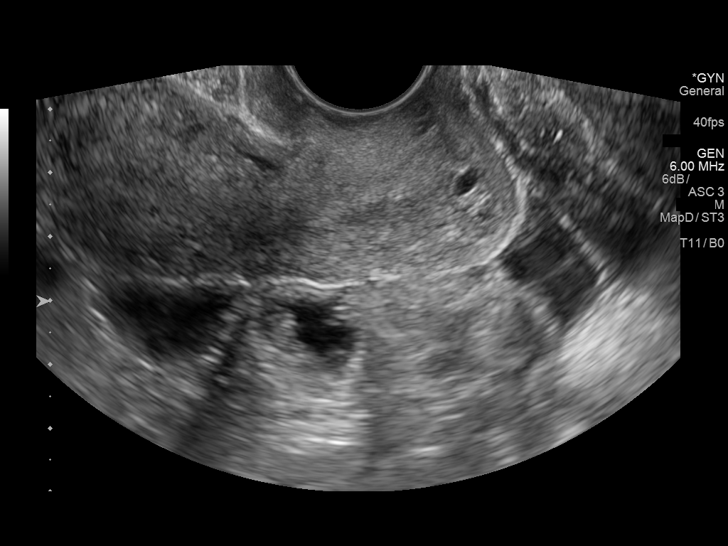
[im 13/49]
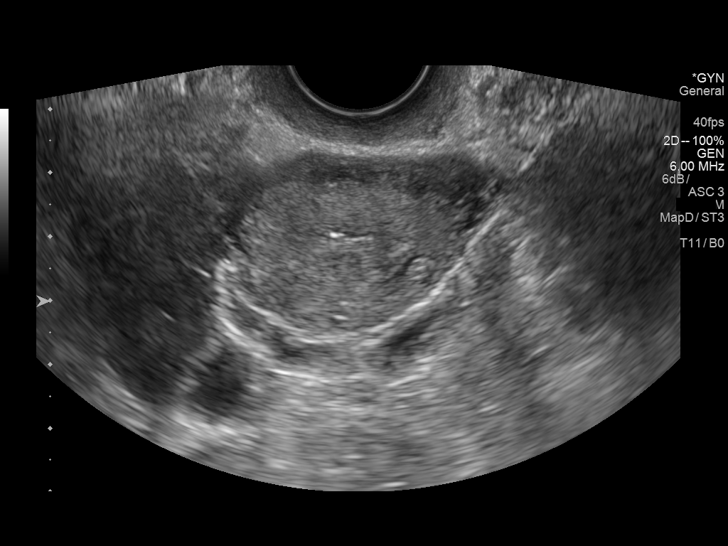
[im 17/49]
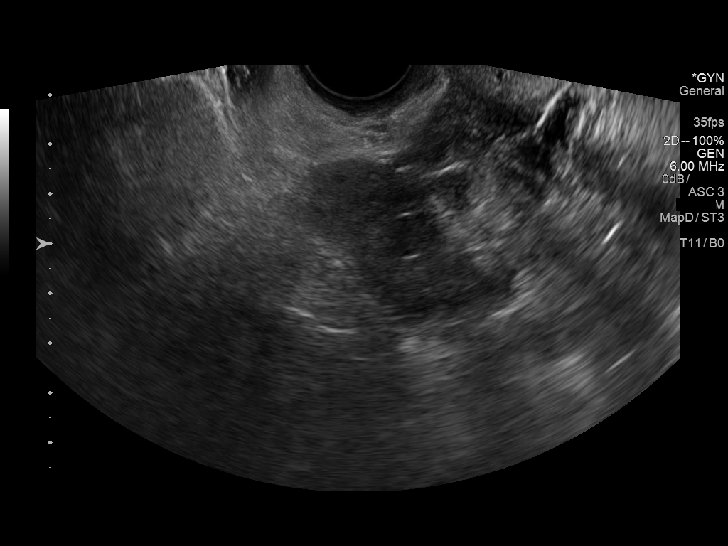
[im 19/49]
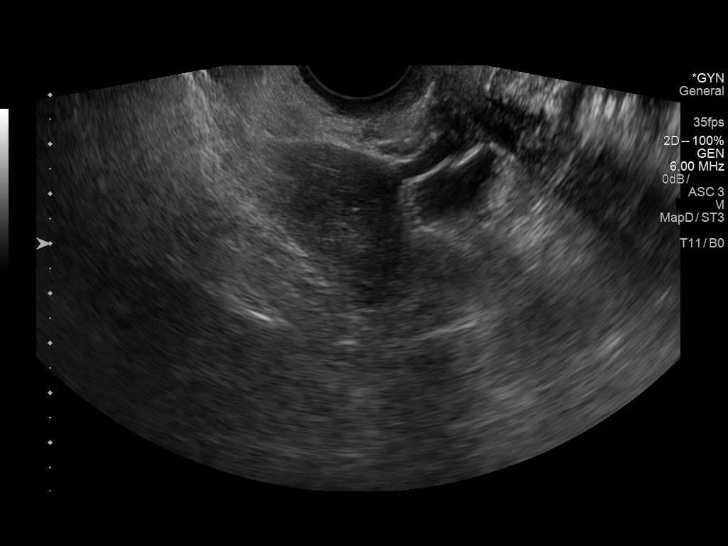
[im 23/49]
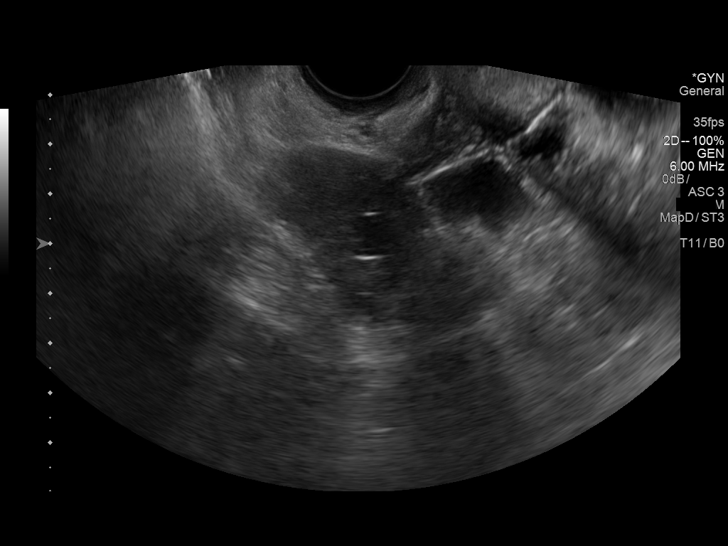
[im 27/49]
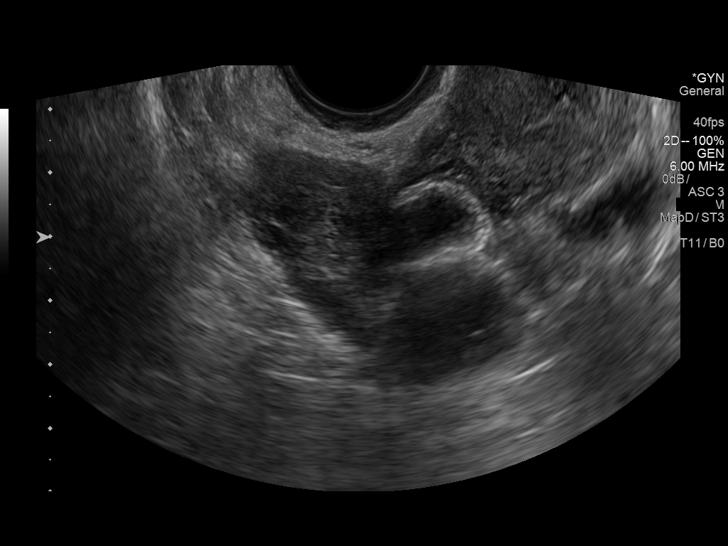
[im 31/49]
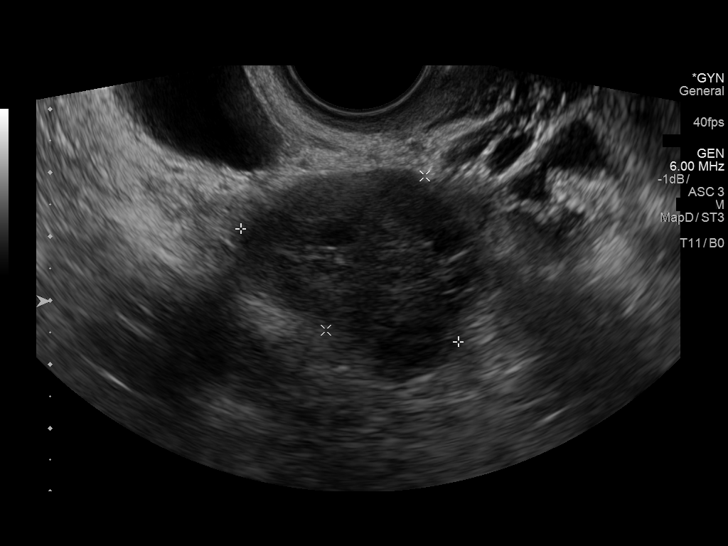
[im 33/49]
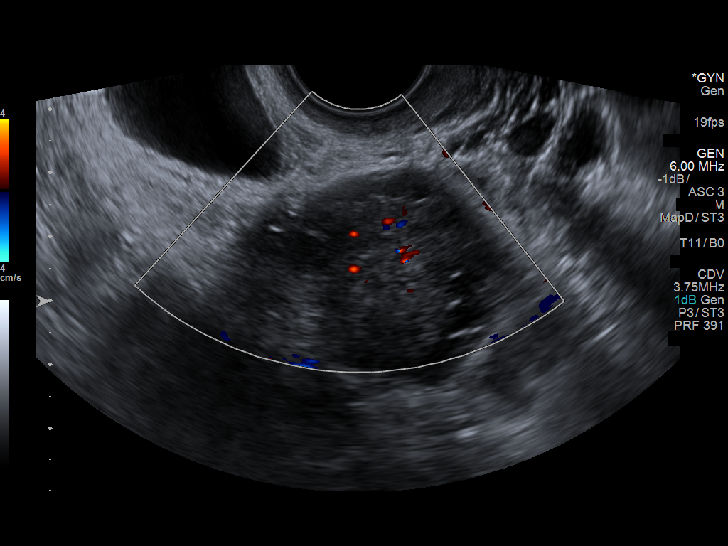
[im 37/49]
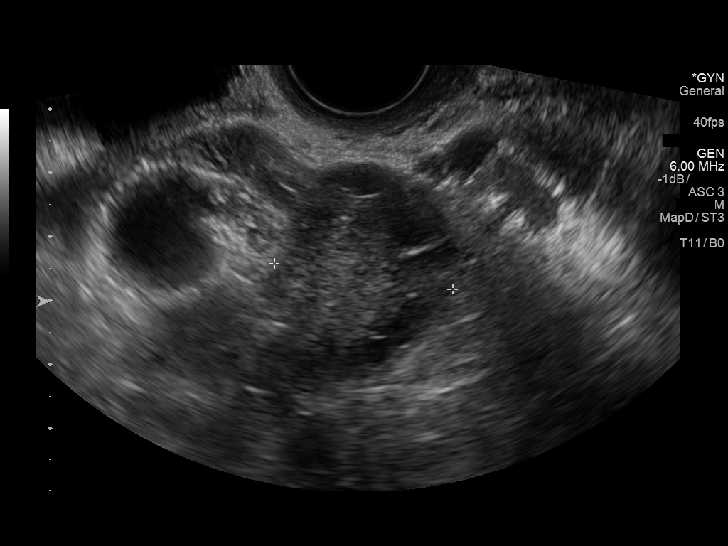
[im 41/49]
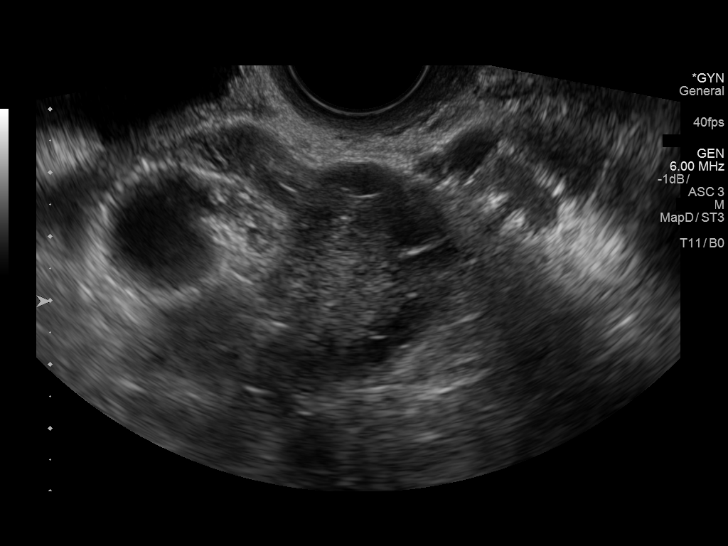
[im 45/49]
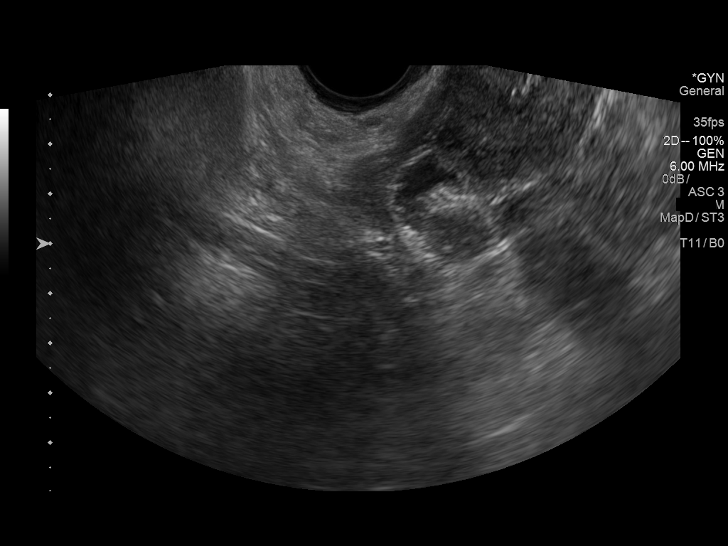
[im 49/49]
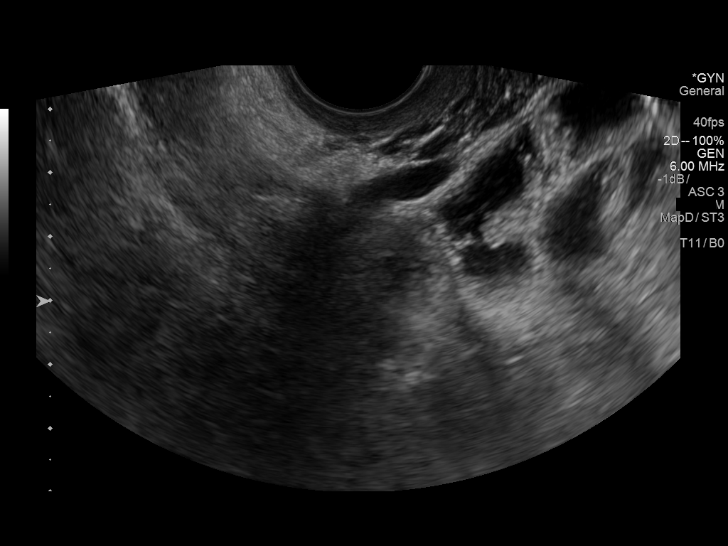

[14 of 25 positions shown; findings below may reference images not displayed]

FINDINGS: Uterus

Measurements: 7.6 x 3.4 x 4.3 cm. No fibroids or other mass
visualized.

Endometrium

Thickness: Normal thickness, 2 mm.  No focal abnormality visualized.

Right ovary

Measurements: 3.9 x 2.2 x 2.5 cm. Normal appearance/no adnexal mass.

Left ovary

Measurements: 3.8 x 2.9 x 2.8 cm. Normal appearance/no adnexal mass.

Other findings

No abnormal free fluid.
IMPRESSION: Normal pelvic ultrasound.

## 2018-08-03 DIAGNOSIS — F4323 Adjustment disorder with mixed anxiety and depressed mood: Secondary | ICD-10-CM | POA: Diagnosis not present

## 2018-08-10 DIAGNOSIS — F4323 Adjustment disorder with mixed anxiety and depressed mood: Secondary | ICD-10-CM | POA: Diagnosis not present

## 2018-08-14 ENCOUNTER — Other Ambulatory Visit: Payer: Self-pay | Admitting: Primary Care

## 2018-08-14 DIAGNOSIS — I10 Essential (primary) hypertension: Secondary | ICD-10-CM

## 2018-08-14 MED FILL — AMLODIPINE BESYLATE 10 MG T: 10 | 90 days supply | Qty: 90 | Fill #0

## 2018-08-17 DIAGNOSIS — F4323 Adjustment disorder with mixed anxiety and depressed mood: Secondary | ICD-10-CM | POA: Diagnosis not present

## 2018-08-20 ENCOUNTER — Telehealth: Payer: Self-pay | Admitting: Primary Care

## 2018-08-20 NOTE — Telephone Encounter (Signed)
Noted. Since patient and school could not find. Ok per [place] for patient to get Tdap.

## 2018-08-20 NOTE — Telephone Encounter (Signed)
Pt stated she need to have a T-dap done. Please advise pt

## 2018-08-24 DIAGNOSIS — F4323 Adjustment disorder with mixed anxiety and depressed mood: Secondary | ICD-10-CM | POA: Diagnosis not present

## 2018-08-27 ENCOUNTER — Ambulatory Visit: Payer: 59

## 2018-08-31 DIAGNOSIS — F4323 Adjustment disorder with mixed anxiety and depressed mood: Secondary | ICD-10-CM | POA: Diagnosis not present

## 2018-09-02 ENCOUNTER — Other Ambulatory Visit: Payer: Self-pay

## 2018-09-02 ENCOUNTER — Ambulatory Visit (INDEPENDENT_AMBULATORY_CARE_PROVIDER_SITE_OTHER): Payer: 59

## 2018-09-02 DIAGNOSIS — Z23 Encounter for immunization: Secondary | ICD-10-CM | POA: Diagnosis not present

## 2018-09-07 DIAGNOSIS — F4323 Adjustment disorder with mixed anxiety and depressed mood: Secondary | ICD-10-CM | POA: Diagnosis not present

## 2018-09-14 DIAGNOSIS — F4323 Adjustment disorder with mixed anxiety and depressed mood: Secondary | ICD-10-CM | POA: Diagnosis not present

## 2018-09-15 ENCOUNTER — Other Ambulatory Visit: Payer: Self-pay | Admitting: Obstetrics & Gynecology

## 2018-09-15 DIAGNOSIS — N926 Irregular menstruation, unspecified: Secondary | ICD-10-CM

## 2018-09-15 MED ORDER — TRANEXAMIC ACID 650 MG PO TABS: 1300.0000 mg | ORAL_TABLET | Freq: Three times a day (TID) | ORAL | 2 refills | Status: DC

## 2018-09-16 MED FILL — TRANEXAMIC ACID 650 MG TAB: 650 | 5 days supply | Qty: 30 | Fill #0

## 2018-09-21 DIAGNOSIS — F4323 Adjustment disorder with mixed anxiety and depressed mood: Secondary | ICD-10-CM | POA: Diagnosis not present

## 2018-09-28 DIAGNOSIS — F4323 Adjustment disorder with mixed anxiety and depressed mood: Secondary | ICD-10-CM | POA: Diagnosis not present

## 2018-10-05 DIAGNOSIS — F4323 Adjustment disorder with mixed anxiety and depressed mood: Secondary | ICD-10-CM | POA: Diagnosis not present

## 2018-10-12 DIAGNOSIS — F4323 Adjustment disorder with mixed anxiety and depressed mood: Secondary | ICD-10-CM | POA: Diagnosis not present

## 2018-10-23 ENCOUNTER — Telehealth: Payer: Self-pay | Admitting: Primary Care

## 2018-10-23 NOTE — Telephone Encounter (Signed)
Pt called requesting a copy of immunizations. She is requesting they be mailed to her.

## 2018-10-26 DIAGNOSIS — F4323 Adjustment disorder with mixed anxiety and depressed mood: Secondary | ICD-10-CM | POA: Diagnosis not present

## 2018-10-27 NOTE — Telephone Encounter (Signed)
Have been trying to print from [place] but that part of their system is not working.

## 2018-10-31 NOTE — Telephone Encounter (Signed)
[facility] is able to print again. I have printed and place in the mail for patient.

## 2018-11-02 DIAGNOSIS — F4323 Adjustment disorder with mixed anxiety and depressed mood: Secondary | ICD-10-CM | POA: Diagnosis not present

## 2018-11-09 DIAGNOSIS — F4323 Adjustment disorder with mixed anxiety and depressed mood: Secondary | ICD-10-CM | POA: Diagnosis not present

## 2018-11-16 DIAGNOSIS — F4323 Adjustment disorder with mixed anxiety and depressed mood: Secondary | ICD-10-CM | POA: Diagnosis not present

## 2018-11-23 DIAGNOSIS — F4323 Adjustment disorder with mixed anxiety and depressed mood: Secondary | ICD-10-CM | POA: Diagnosis not present

## 2018-11-26 ENCOUNTER — Telehealth: Payer: Self-pay | Admitting: *Deleted

## 2018-11-26 DIAGNOSIS — N926 Irregular menstruation, unspecified: Secondary | ICD-10-CM

## 2018-11-26 NOTE — Telephone Encounter (Signed)
Patient called stating that she has been seeing a GYN for vaginal bleeding. Patient stated that she stopped depo injections and has had bleeding off and on for several months. Patient stated that she is not really happy with the GYN's care. Patient stated that she has been given medication to stop the bleeding with has not helped.. Patient wants to know what Allie Bossier NP recommends since she is continuing to have bleeding?

## 2018-11-26 NOTE — Telephone Encounter (Signed)
I recommend we send her to another GYN for evaluation given her ongoing symptoms. Which GYN practice is she at currently?

## 2018-11-26 NOTE — Telephone Encounter (Signed)
Per DPR, left detail message of Kate Clark's comments for patient to call back 

## 2018-11-30 DIAGNOSIS — F4323 Adjustment disorder with mixed anxiety and depressed mood: Secondary | ICD-10-CM | POA: Diagnosis not present

## 2018-12-01 NOTE — Telephone Encounter (Signed)
FYI

## 2018-12-01 NOTE — Telephone Encounter (Signed)
Sent patient a message through My Chart.

## 2018-12-01 NOTE — Telephone Encounter (Signed)
Noted  

## 2018-12-07 DIAGNOSIS — F4323 Adjustment disorder with mixed anxiety and depressed mood: Secondary | ICD-10-CM | POA: Diagnosis not present

## 2018-12-08 ENCOUNTER — Ambulatory Visit
Admission: RE | Admit: 2018-12-08 | Discharge: 2018-12-08 | Disposition: A | Discharge status: Home / Self Care | Payer: BC Managed Care – PPO | Source: Ambulatory Visit | Attending: Obstetrics & Gynecology | Admitting: Obstetrics & Gynecology

## 2018-12-08 ENCOUNTER — Other Ambulatory Visit: Payer: Self-pay

## 2018-12-08 DIAGNOSIS — N939 Abnormal uterine and vaginal bleeding, unspecified: Secondary | ICD-10-CM | POA: Diagnosis not present

## 2018-12-08 DIAGNOSIS — N926 Irregular menstruation, unspecified: Secondary | ICD-10-CM | POA: Insufficient documentation

## 2018-12-08 NOTE — Progress Notes (Signed)
Patient is having abnormal bleeding after stopping Depo Provera earlier this year, this unfortunately can occur. No obvious structural etiology for her bleeding, it is anovulatory.  According to the notes by her PCP team, patient is not happy with the GYN care our office provides for her (we last saw her in 06/2018 for annual). Please call and tell her of the results; she can make the decision to have a visit with me or another provider, or another practice if she prefers, to discuss further management.   Verita Schneiders, MD, [place] for [employer], [place]

## 2018-12-09 DIAGNOSIS — F4323 Adjustment disorder with mixed anxiety and depressed mood: Secondary | ICD-10-CM | POA: Diagnosis not present

## 2018-12-10 ENCOUNTER — Telehealth: Payer: Self-pay

## 2018-12-10 NOTE — Telephone Encounter (Signed)
Patient has been given her ultrasound result and was very happy to hear the news. Patient will follow up as needed.

## 2018-12-10 NOTE — Telephone Encounter (Signed)
-----   Message from Osborne Oman, MD sent at 12/08/2018  8:43 PM EDT ----- Patient is having abnormal bleeding after stopping Depo Provera earlier this year, this unfortunately can occur. No obvious structural etiology for her bleeding, it is anovulatory.  According to the notes by her PCP team, patient is not happy with the GYN care our office provides for her (we last saw her in 06/2018 for annual). Please call and tell her of the results; she can make the decision to have a visit with me or another provider, or another practice if she prefers, to discuss further management.   Verita Schneiders, MD, [place] for [employer], [place]

## 2018-12-14 ENCOUNTER — Other Ambulatory Visit: Payer: Self-pay

## 2018-12-14 DIAGNOSIS — F4323 Adjustment disorder with mixed anxiety and depressed mood: Secondary | ICD-10-CM | POA: Diagnosis not present

## 2018-12-14 DIAGNOSIS — I10 Essential (primary) hypertension: Secondary | ICD-10-CM

## 2018-12-14 MED ORDER — AMLODIPINE BESYLATE 10 MG PO TABS: 10.0000 mg | ORAL_TABLET | Freq: Every day | ORAL | 0 refills | Status: DC

## 2018-12-16 DIAGNOSIS — F4323 Adjustment disorder with mixed anxiety and depressed mood: Secondary | ICD-10-CM | POA: Diagnosis not present

## 2018-12-21 DIAGNOSIS — F4323 Adjustment disorder with mixed anxiety and depressed mood: Secondary | ICD-10-CM | POA: Diagnosis not present

## 2018-12-24 DIAGNOSIS — F4323 Adjustment disorder with mixed anxiety and depressed mood: Secondary | ICD-10-CM | POA: Diagnosis not present

## 2018-12-28 DIAGNOSIS — F4323 Adjustment disorder with mixed anxiety and depressed mood: Secondary | ICD-10-CM | POA: Diagnosis not present

## 2018-12-31 DIAGNOSIS — F4323 Adjustment disorder with mixed anxiety and depressed mood: Secondary | ICD-10-CM | POA: Diagnosis not present

## 2019-01-04 DIAGNOSIS — F4323 Adjustment disorder with mixed anxiety and depressed mood: Secondary | ICD-10-CM | POA: Diagnosis not present

## 2019-01-05 DIAGNOSIS — F4323 Adjustment disorder with mixed anxiety and depressed mood: Secondary | ICD-10-CM | POA: Diagnosis not present

## 2019-01-11 DIAGNOSIS — F4323 Adjustment disorder with mixed anxiety and depressed mood: Secondary | ICD-10-CM | POA: Diagnosis not present

## 2019-01-12 DIAGNOSIS — F4323 Adjustment disorder with mixed anxiety and depressed mood: Secondary | ICD-10-CM | POA: Diagnosis not present

## 2019-01-15 ENCOUNTER — Telehealth: Payer: BC Managed Care – PPO | Admitting: Nurse Practitioner

## 2019-01-15 DIAGNOSIS — R110 Nausea: Secondary | ICD-10-CM

## 2019-01-15 NOTE — Progress Notes (Signed)
Based on what you shared with me it looks like you have nausea possibly due to pregnancy,that should be evaluated in a face to face office visit. Need to know fpor sure if pregnat because ethere are some medications you cannot have while pregnant. You will need to see your PCP or OB.   NOTE: If you entered your credit card information for this eVisit, you will not be charged. You may see a "hold" on your card for the $30 but that hold will drop off and you will not have a charge processed.  If you are having a true medical emergency please call 911.  If you need an urgent face to face visit, [facility] has four urgent care centers for your convenience.  If you need care fast and have a high deductible or no insurance consider:   [email or web address] to reserve your spot online an avoid wait times  [facility] [address], [address] [place], [place] [address] 8 am to 8 pm Monday-Friday 10 am to 4 pm Saturday-Sunday *Across the street from [employer]  [place], [address] 8 am to 5 pm Monday-Friday * In the [facility] on the [facility]   The following sites will take your  insurance:  . [facility]gent [place] a Provider at this Location  [address] [place], [place] [address] . 10 am to 8 pm Monday-Friday . 12 pm to 8 pm Saturday-Sunday   . [facility] at [place] a Provider at this Location  [address], [place], [place] [address] . 8 am to 8 pm Monday-Friday . 9 am to 6 pm Saturday . 11 am to 6 pm Sunday   . [facility] at [place] Get Driving Directions  [address] [place], [place] [address] . 8 am to 8 pm Monday-Friday . 8 am to 4 pm Saturday-Sunday   Your e-visit answers were reviewed by a board  certified advanced clinical practitioner to complete your personal care plan.  5-10 minutes spent reviewing and documenting in chart.

## 2019-01-18 DIAGNOSIS — F4323 Adjustment disorder with mixed anxiety and depressed mood: Secondary | ICD-10-CM | POA: Diagnosis not present

## 2019-01-19 DIAGNOSIS — F4323 Adjustment disorder with mixed anxiety and depressed mood: Secondary | ICD-10-CM | POA: Diagnosis not present

## 2019-01-23 ENCOUNTER — Telehealth: Payer: BC Managed Care – PPO | Admitting: Family

## 2019-01-23 DIAGNOSIS — R300 Dysuria: Secondary | ICD-10-CM | POA: Diagnosis not present

## 2019-01-23 MED ORDER — NITROFURANTOIN MONOHYD MACRO 100 MG PO CAPS: 100.0000 mg | ORAL_CAPSULE | Freq: Two times a day (BID) | ORAL | 0 refills | Status: DC

## 2019-01-23 NOTE — Progress Notes (Signed)

## 2019-01-25 DIAGNOSIS — F4323 Adjustment disorder with mixed anxiety and depressed mood: Secondary | ICD-10-CM | POA: Diagnosis not present

## 2019-01-26 DIAGNOSIS — F4323 Adjustment disorder with mixed anxiety and depressed mood: Secondary | ICD-10-CM | POA: Diagnosis not present

## 2019-02-02 DIAGNOSIS — F4323 Adjustment disorder with mixed anxiety and depressed mood: Secondary | ICD-10-CM | POA: Diagnosis not present

## 2019-02-03 DIAGNOSIS — F4323 Adjustment disorder with mixed anxiety and depressed mood: Secondary | ICD-10-CM | POA: Diagnosis not present

## 2019-02-08 DIAGNOSIS — F4323 Adjustment disorder with mixed anxiety and depressed mood: Secondary | ICD-10-CM | POA: Diagnosis not present

## 2019-02-09 DIAGNOSIS — F4323 Adjustment disorder with mixed anxiety and depressed mood: Secondary | ICD-10-CM | POA: Diagnosis not present

## 2019-02-15 DIAGNOSIS — F4323 Adjustment disorder with mixed anxiety and depressed mood: Secondary | ICD-10-CM | POA: Diagnosis not present

## 2019-02-16 DIAGNOSIS — F4323 Adjustment disorder with mixed anxiety and depressed mood: Secondary | ICD-10-CM | POA: Diagnosis not present

## 2019-02-22 DIAGNOSIS — F4323 Adjustment disorder with mixed anxiety and depressed mood: Secondary | ICD-10-CM | POA: Diagnosis not present

## 2019-02-23 DIAGNOSIS — F4323 Adjustment disorder with mixed anxiety and depressed mood: Secondary | ICD-10-CM | POA: Diagnosis not present

## 2019-03-01 DIAGNOSIS — F4323 Adjustment disorder with mixed anxiety and depressed mood: Secondary | ICD-10-CM | POA: Diagnosis not present

## 2019-03-08 DIAGNOSIS — F4323 Adjustment disorder with mixed anxiety and depressed mood: Secondary | ICD-10-CM | POA: Diagnosis not present

## 2019-03-15 DIAGNOSIS — F4323 Adjustment disorder with mixed anxiety and depressed mood: Secondary | ICD-10-CM | POA: Diagnosis not present

## 2019-03-22 DIAGNOSIS — F4323 Adjustment disorder with mixed anxiety and depressed mood: Secondary | ICD-10-CM | POA: Diagnosis not present

## 2019-03-23 DIAGNOSIS — F4323 Adjustment disorder with mixed anxiety and depressed mood: Secondary | ICD-10-CM | POA: Diagnosis not present

## 2019-03-29 DIAGNOSIS — F4323 Adjustment disorder with mixed anxiety and depressed mood: Secondary | ICD-10-CM | POA: Diagnosis not present

## 2019-03-30 DIAGNOSIS — F4323 Adjustment disorder with mixed anxiety and depressed mood: Secondary | ICD-10-CM | POA: Diagnosis not present

## 2019-04-12 DIAGNOSIS — F4323 Adjustment disorder with mixed anxiety and depressed mood: Secondary | ICD-10-CM | POA: Diagnosis not present

## 2019-04-15 DIAGNOSIS — F4323 Adjustment disorder with mixed anxiety and depressed mood: Secondary | ICD-10-CM | POA: Diagnosis not present

## 2019-04-19 DIAGNOSIS — F4323 Adjustment disorder with mixed anxiety and depressed mood: Secondary | ICD-10-CM | POA: Diagnosis not present

## 2019-04-25 ENCOUNTER — Other Ambulatory Visit: Payer: Self-pay

## 2019-04-25 DIAGNOSIS — I10 Essential (primary) hypertension: Secondary | ICD-10-CM

## 2019-04-26 DIAGNOSIS — F4323 Adjustment disorder with mixed anxiety and depressed mood: Secondary | ICD-10-CM | POA: Diagnosis not present

## 2019-04-27 MED ORDER — AMLODIPINE BESYLATE 10 MG PO TABS: 10.0000 mg | ORAL_TABLET | Freq: Every day | ORAL | 0 refills | Status: DC

## 2019-05-03 DIAGNOSIS — F4323 Adjustment disorder with mixed anxiety and depressed mood: Secondary | ICD-10-CM | POA: Diagnosis not present

## 2019-05-05 ENCOUNTER — Encounter: Payer: Self-pay | Admitting: Radiology

## 2019-05-10 DIAGNOSIS — F4323 Adjustment disorder with mixed anxiety and depressed mood: Secondary | ICD-10-CM | POA: Diagnosis not present

## 2019-05-11 DIAGNOSIS — F4323 Adjustment disorder with mixed anxiety and depressed mood: Secondary | ICD-10-CM | POA: Diagnosis not present

## 2019-05-17 DIAGNOSIS — F4323 Adjustment disorder with mixed anxiety and depressed mood: Secondary | ICD-10-CM | POA: Diagnosis not present

## 2019-05-24 DIAGNOSIS — F4323 Adjustment disorder with mixed anxiety and depressed mood: Secondary | ICD-10-CM | POA: Diagnosis not present

## 2019-05-31 DIAGNOSIS — F4323 Adjustment disorder with mixed anxiety and depressed mood: Secondary | ICD-10-CM | POA: Diagnosis not present

## 2019-06-01 DIAGNOSIS — F4323 Adjustment disorder with mixed anxiety and depressed mood: Secondary | ICD-10-CM | POA: Diagnosis not present

## 2019-06-07 DIAGNOSIS — F4323 Adjustment disorder with mixed anxiety and depressed mood: Secondary | ICD-10-CM | POA: Diagnosis not present

## 2019-06-14 DIAGNOSIS — F4323 Adjustment disorder with mixed anxiety and depressed mood: Secondary | ICD-10-CM | POA: Diagnosis not present

## 2019-06-21 DIAGNOSIS — F4323 Adjustment disorder with mixed anxiety and depressed mood: Secondary | ICD-10-CM | POA: Diagnosis not present

## 2019-06-28 DIAGNOSIS — F4323 Adjustment disorder with mixed anxiety and depressed mood: Secondary | ICD-10-CM | POA: Diagnosis not present

## 2019-07-06 NOTE — Telephone Encounter (Signed)
Error

## 2019-07-13 DIAGNOSIS — F4323 Adjustment disorder with mixed anxiety and depressed mood: Secondary | ICD-10-CM | POA: Diagnosis not present

## 2019-07-22 ENCOUNTER — Other Ambulatory Visit: Payer: Self-pay

## 2019-07-22 ENCOUNTER — Other Ambulatory Visit: Payer: Self-pay | Admitting: Family Medicine

## 2019-07-22 DIAGNOSIS — G43001 Migraine without aura, not intractable, with status migrainosus: Secondary | ICD-10-CM

## 2019-07-22 DIAGNOSIS — F4323 Adjustment disorder with mixed anxiety and depressed mood: Secondary | ICD-10-CM | POA: Diagnosis not present

## 2019-07-22 NOTE — Telephone Encounter (Signed)
Needs follow-up visit for further refills.  Okay to meet virtually as I see that she may be living in [place].

## 2019-07-22 NOTE — Telephone Encounter (Signed)
Patient is request refill through MyChart  Last prescribed on 02/13/2018 . Last appointment on 07/09/2018 . No future appointment

## 2019-07-22 NOTE — Telephone Encounter (Signed)
Message left for patient to return my call.  

## 2019-07-22 NOTE — Telephone Encounter (Signed)
Also sent a message through MyChart

## 2019-07-26 DIAGNOSIS — F4323 Adjustment disorder with mixed anxiety and depressed mood: Secondary | ICD-10-CM | POA: Diagnosis not present

## 2019-08-09 DIAGNOSIS — F4323 Adjustment disorder with mixed anxiety and depressed mood: Secondary | ICD-10-CM | POA: Diagnosis not present

## 2019-08-11 ENCOUNTER — Ambulatory Visit: Payer: BC Managed Care – PPO | Admitting: Primary Care

## 2019-08-11 ENCOUNTER — Telehealth: Payer: Self-pay

## 2019-08-11 DIAGNOSIS — Z0289 Encounter for other administrative examinations: Secondary | ICD-10-CM

## 2019-08-11 NOTE — Telephone Encounter (Signed)
Called to see if patient wanted to reschedule, lvm asking pt to call office.

## 2019-08-11 NOTE — Telephone Encounter (Signed)
[facility] Night - Client Nonclinical Telephone Record AccessNurse Client [facility] Night - Client Client Site [facility] - Night Physician Vernona Rieger - NP Contact Type Call Who Is Calling Patient / Member / Family / Caregiver Caller Name [name] Caller Phone Number [phone] Patient Name Veronica Foster Patient DOB 1986/01/30 Call Type Message Only Information Provided Reason for Call Request to Reschedule Office Appointment Initial Comment Caller states she has an appointment today. She forgot her wallet and had to go back. She says she won't make it in time so she needs to reschedule. Additional Comment Declined triage. Disp. Time Disposition Final User 08/11/2019 7:54:26 AM General Information Provided Yes Alyse Low Call Closed By: Alyse Low Transaction Date/Time: 08/11/2019 7:52:07 AM (ET)

## 2019-08-12 NOTE — Telephone Encounter (Signed)
[facility] Night - Client Nonclinical Telephone Record AccessNurse Client [facility] Night - Client Client Site [facility] - Night Physician Vernona Rieger - NP Contact Type Call Who Is Calling Patient / Member / Family / Caregiver Caller Name Veronica Foster Caller Phone Number [phone] Call Type Message Only Information Provided Reason for Call Returning a Call from the Office Initial Comment Caller states she missed a call from the office and believes it is in regards to the appt that she missed this morning Additional Comment Caller states she missed a call from the office and believes it is in regards to the appt she missed this morning. Office hours provided Disp. Time Disposition Final User 08/11/2019 5:06:19 PM General Information Provided Yes Joselyn Glassman Call Closed By: Joselyn Glassman Transaction Date/Time: 08/11/2019 5:03:35 PM (ET)

## 2019-08-23 DIAGNOSIS — L814 Other melanin hyperpigmentation: Secondary | ICD-10-CM | POA: Diagnosis not present

## 2019-08-23 DIAGNOSIS — B353 Tinea pedis: Secondary | ICD-10-CM | POA: Diagnosis not present

## 2019-08-23 DIAGNOSIS — F4323 Adjustment disorder with mixed anxiety and depressed mood: Secondary | ICD-10-CM | POA: Diagnosis not present

## 2019-08-25 ENCOUNTER — Ambulatory Visit (INDEPENDENT_AMBULATORY_CARE_PROVIDER_SITE_OTHER): Payer: BC Managed Care – PPO | Admitting: Primary Care

## 2019-08-25 ENCOUNTER — Other Ambulatory Visit: Payer: Self-pay | Admitting: Primary Care

## 2019-08-25 ENCOUNTER — Other Ambulatory Visit: Payer: Self-pay

## 2019-08-25 VITALS — BP 126/82 | HR 92 | Temp 96.8°F | Ht 63.0 in | Wt 219.5 lb

## 2019-08-25 DIAGNOSIS — Z0000 Encounter for general adult medical examination without abnormal findings: Secondary | ICD-10-CM

## 2019-08-25 DIAGNOSIS — B369 Superficial mycosis, unspecified: Secondary | ICD-10-CM | POA: Insufficient documentation

## 2019-08-25 DIAGNOSIS — E282 Polycystic ovarian syndrome: Secondary | ICD-10-CM

## 2019-08-25 DIAGNOSIS — G43001 Migraine without aura, not intractable, with status migrainosus: Secondary | ICD-10-CM | POA: Diagnosis not present

## 2019-08-25 DIAGNOSIS — R7303 Prediabetes: Secondary | ICD-10-CM

## 2019-08-25 DIAGNOSIS — I10 Essential (primary) hypertension: Secondary | ICD-10-CM

## 2019-08-25 DIAGNOSIS — D509 Iron deficiency anemia, unspecified: Secondary | ICD-10-CM | POA: Diagnosis not present

## 2019-08-25 LAB — COMPREHENSIVE METABOLIC PANEL
ALT: 25 U/L (ref 0–35)
AST: 16 U/L (ref 0–37)
Albumin: 4.2 g/dL (ref 3.5–5.2)
Alkaline Phosphatase: 69 U/L (ref 39–117)
BUN: 8 mg/dL (ref 6–23)
CO2: 27 mEq/L (ref 19–32)
Calcium: 8.9 mg/dL (ref 8.4–10.5)
Chloride: 107 mEq/L (ref 96–112)
Creatinine, Ser: 0.73 mg/dL (ref 0.40–1.20)
GFR: 110.17 mL/min (ref >60.00)
Glucose, Bld: 91 mg/dL (ref 70–99)
Potassium: 3.6 mEq/L (ref 3.5–5.1)
Sodium: 140 mEq/L (ref 135–145)
Total Bilirubin: 0.5 mg/dL (ref 0.2–1.2)
Total Protein: 7.5 g/dL (ref 6.0–8.3)

## 2019-08-25 LAB — HEMOGLOBIN A1C: Hgb A1c MFr Bld: 5.7 % (ref 4.6–6.5)

## 2019-08-25 LAB — LIPID PANEL
Cholesterol: 133 mg/dL (ref 0–200)
HDL: 50.8 mg/dL (ref >39.00)
LDL Cholesterol: 73 mg/dL (ref 0–99)
NonHDL: 82.65
Total CHOL/HDL Ratio: 3
Triglycerides: 49 mg/dL (ref 0.0–149.0)
VLDL: 9.8 mg/dL (ref 0.0–40.0)

## 2019-08-25 LAB — CBC
HCT: 38.6 % (ref 36.0–46.0)
Hemoglobin: 12.4 g/dL (ref 12.0–15.0)
MCHC: 32.2 g/dL (ref 30.0–36.0)
MCV: 85 fl (ref 78.0–100.0)
Platelets: 395 10*3/uL (ref 150.0–400.0)
RBC: 4.54 Mil/uL (ref 3.87–5.11)
RDW: 14.7 % (ref 11.5–15.5)
WBC: 7.2 10*3/uL (ref 4.0–10.5)

## 2019-08-25 LAB — IBC + FERRITIN
Ferritin: 12.6 ng/mL (ref 10.0–291.0)
Iron: 48 ug/dL (ref 42–145)
Saturation Ratios: 14.1 % — ABNORMAL LOW (ref 20.0–50.0)
Transferrin: 243 mg/dL (ref 212.0–360.0)

## 2019-08-25 MED ORDER — RIZATRIPTAN BENZOATE 5 MG PO TABS: ORAL_TABLET | ORAL | 0 refills | Status: DC

## 2019-08-25 MED ORDER — AMLODIPINE BESYLATE 10 MG PO TABS: 10.0000 mg | ORAL_TABLET | Freq: Every day | ORAL | 3 refills | Status: DC

## 2019-08-25 MED ORDER — METFORMIN HCL ER 500 MG PO TB24: 500.0000 mg | ORAL_TABLET | Freq: Every day | ORAL | 1 refills | Status: DC

## 2019-08-25 NOTE — Assessment & Plan Note (Addendum)
Continues with irregular menstrual cycles, has appointment with OB/GYN.  A1C pending, consider Metformin.

## 2019-08-25 NOTE — Assessment & Plan Note (Signed)
Following with dermatology, doing well on Lamisil. Continue same.

## 2019-08-25 NOTE — Addendum Note (Signed)
Addended by: Doreene Nest on: 08/25/2019 09:28 AM   Modules accepted: Orders

## 2019-08-25 NOTE — Assessment & Plan Note (Signed)
Compliant to oral iron 2-3 times weekly. Repeat iron studies pending.

## 2019-08-25 NOTE — Assessment & Plan Note (Signed)
Well controled on Amlodipine, continue same.

## 2019-08-25 NOTE — Assessment & Plan Note (Signed)
Immunizations UTD. Pap smear UTD. Encouraged a healthy diet, regular exercise.  Exam today unremarkable. Labs pending.

## 2019-08-25 NOTE — Assessment & Plan Note (Signed)
Eb and flow of migraines, overall stable now that she's quit her stressful job. Refill for Maxalt provided. She will update if migraines increase in frequency.

## 2019-08-25 NOTE — Progress Notes (Signed)
Subjective:    Patient ID: Najiyah Paris, female    DOB: [birth year], 34 y.o.   MRN: [id number]  HPI  This visit occurred during the SARS-CoV-2 public health emergency.  Safety protocols were in place, including screening questions prior to the visit, additional usage of staff PPE, and extensive cleaning of exam room while observing appropriate contact time as indicated for disinfecting solutions.   Ms. Gebhardt is a 34 year old female who presents today for complete physical.  Immunizations: -Tetanus: Completed in 2020 -Influenza: Completed this season   Diet: She endorses a healthy diet. Exercise: She is walking, playing tennis, active in her yard.  Eye exam: Completed in 2021 Dental exam: Completes semi-annually   Pap Smear: Completed in 2020  BP Readings from Last 3 Encounters:  08/25/19 126/82  07/14/18 120/79  07/09/18 122/76      Review of Systems  Constitutional: Negative for unexpected weight change.  HENT: Negative for rhinorrhea.   Respiratory: Negative for cough and shortness of breath.   Cardiovascular: Negative for chest pain.  Gastrointestinal: Negative for constipation and diarrhea.  Genitourinary: Negative for difficulty urinating.  Musculoskeletal: Negative for arthralgias and myalgias.  Skin: Negative for rash.  Allergic/Immunologic: Negative for environmental allergies.  Neurological: Positive for headaches. Negative for dizziness.       Migraines once weekly lasting a few days, then will go months without.   Psychiatric/Behavioral: The patient is not nervous/anxious.        Past Medical History:  Diagnosis Date  . Chicken pox   . GERD (gastroesophageal reflux disease)   . History of UTI   . Migraine   . Murmur   . PCOS (polycystic ovarian syndrome) 12/30/2014     Social History   Socioeconomic History  . Marital status: Single    Spouse name: Not on file  . Number of children: Not on file  . Years of education: Not on file  . Highest  education level: Not on file  Occupational History  . Not on file  Tobacco Use  . Smoking status: Never Smoker  . Smokeless tobacco: Never Used  Substance and Sexual Activity  . Alcohol use: Yes    Alcohol/week: 0.0 standard drinks    Comment: social  . Drug use: No  . Sexual activity: Yes    Birth control/protection: None  Other Topics Concern  . Not on file  Social History Narrative   Single.   Highest level of education is Masters.   Works for [employer] in [employer].   Enjoys traveling, reading, wine tasting.   Social Determinants of Health   Financial Resource Strain:   . Difficulty of Paying Living Expenses:   Food Insecurity:   . Worried About Charity fundraiser in the Last Year:   . Arboriculturist in the Last Year:   Transportation Needs:   . Film/video editor (Medical):   Marland Kitchen Lack of Transportation (Non-Medical):   Physical Activity:   . Days of Exercise per Week:   . Minutes of Exercise per Session:   Stress:   . Feeling of Stress :   Social Connections:   . Frequency of Communication with Friends and Family:   . Frequency of Social Gatherings with Friends and Family:   . Attends Religious Services:   . Active Member of Clubs or Organizations:   . Attends Archivist Meetings:   Marland Kitchen Marital Status:   Intimate Partner Violence:   . Fear of Current  or Ex-Partner:   . Emotionally Abused:   Marland Kitchen Physically Abused:   . Sexually Abused:     Past Surgical History:  Procedure Laterality Date  . CHOLECYSTECTOMY OPEN  2014    Family History  Problem Relation Age of Onset  . Cancer Mother        colon  . Stroke Mother   . Hypertension Mother   . Arthritis Paternal Aunt   . Hypertension Maternal Grandmother   . Cancer Maternal Grandfather        prostate/pancreatic  . Hypertension Maternal Grandfather   . Arthritis Paternal Grandmother   . Hypertension Paternal Grandmother   . Cancer Paternal Grandfather        prostate  . Heart  disease Paternal Grandfather   . Stroke Paternal Grandfather   . Hypertension Paternal Grandfather     No Known Allergies  Current Outpatient Medications on File Prior to Visit  Medication Sig Dispense Refill  . amLODipine (NORVASC) 10 MG tablet Take 1 tablet (10 mg total) by mouth daily. NEED FOLLOW UP APPOINTMENT FOR ANY MORE REFILLS 90 tablet 0  . ferrous sulfate 325 (65 FE) MG tablet Take 325 mg by mouth 2 (two) times a week.    . rizatriptan (MAXALT) 5 MG tablet Take 1 tablet by mouth at migraine onset. May repeat in 2 hours if needed. Do not exceed 2 tablets in 24 hours. 10 tablet 0  . terbinafine (LAMISIL) 250 MG tablet Take 250 mg by mouth daily.     Current Facility-Administered Medications on File Prior to Visit  Medication Dose Route Frequency Provider Last Rate Last Admin  . medroxyPROGESTERone (DEPO-PROVERA) injection 150 mg  150 mg Intramuscular Q90 days Anyanwu, Jethro Bastos, MD   150 mg at 03/20/18 0821  . medroxyPROGESTERone (DEPO-PROVERA) injection 150 mg  150 mg Intramuscular Once Anyanwu, Ugonna A, MD        BP 126/82   Pulse 92   Temp (!) 96.8 F (36 C) (Temporal)   Ht 5\' 3"  (1.6 m)   Wt 219 lb 8 oz (99.6 kg)   SpO2 98%   BMI 38.88 kg/m    Objective:   Physical Exam  Constitutional: She is oriented to person, place, and time. She appears well-nourished.  HENT:  Right Ear: Tympanic membrane and ear canal normal.  Left Ear: Tympanic membrane and ear canal normal.  Mouth/Throat: Oropharynx is clear and moist.  Eyes: Pupils are equal, round, and reactive to light. EOM are normal.  Cardiovascular: Normal rate and regular rhythm.  Respiratory: Effort normal and breath sounds normal.  GI: Soft. Bowel sounds are normal. There is no abdominal tenderness.  Musculoskeletal:        General: Normal range of motion.     Cervical back: Neck supple.  Neurological: She is alert and oriented to person, place, and time. No cranial nerve deficit.  Reflex Scores:       Patellar reflexes are 2+ on the right side and 2+ on the left side. Skin: Skin is warm and dry.  Psychiatric: She has a normal mood and affect.           Assessment & Plan:

## 2019-08-25 NOTE — Assessment & Plan Note (Signed)
Repeat A1C pending. Encouraged a healthy diet, regular exercise.

## 2019-08-25 NOTE — Patient Instructions (Signed)
Stop by the lab prior to leaving today. I will notify you of your results once received.   Please update me if your migraines increase in frequency.   Continue exercising. You should be getting 150 minutes of moderate intensity exercise weekly.  Continue to work on a healthy diet. Ensure you are consuming 64 ounces of water daily.  It was a pleasure to see you today!   Preventive Care 13-35 Years Old, Female Preventive care refers to visits with your health care provider and lifestyle choices that can promote health and wellness. This includes:  A yearly physical exam. This may also be called an annual well check.  Regular dental visits and eye exams.  Immunizations.  Screening for certain conditions.  Healthy lifestyle choices, such as eating a healthy diet, getting regular exercise, not using drugs or products that contain nicotine and tobacco, and limiting alcohol use. What can I expect for my preventive care visit? Physical exam Your health care provider will check your:  Height and weight. This may be used to calculate body mass index (BMI), which tells if you are at a healthy weight.  Heart rate and blood pressure.  Skin for abnormal spots. Counseling Your health care provider may ask you questions about your:  Alcohol, tobacco, and drug use.  Emotional well-being.  Home and relationship well-being.  Sexual activity.  Eating habits.  Work and work Statistician.  Method of birth control.  Menstrual cycle.  Pregnancy history. What immunizations do I need?  Influenza (flu) vaccine  This is recommended every year. Tetanus, diphtheria, and pertussis (Tdap) vaccine  You may need a Td booster every 10 years. Varicella (chickenpox) vaccine  You may need this if you have not been vaccinated. Human papillomavirus (HPV) vaccine  If recommended by your health care provider, you may need three doses over 6 months. Measles, mumps, and rubella (MMR)  vaccine  You may need at least one dose of MMR. You may also need a second dose. Meningococcal conjugate (MenACWY) vaccine  One dose is recommended if you are age 74-21 years and a first-year college student living in a residence hall, or if you have one of several medical conditions. You may also need additional booster doses. Pneumococcal conjugate (PCV13) vaccine  You may need this if you have certain conditions and were not previously vaccinated. Pneumococcal polysaccharide (PPSV23) vaccine  You may need one or two doses if you smoke cigarettes or if you have certain conditions. Hepatitis A vaccine  You may need this if you have certain conditions or if you travel or work in places where you may be exposed to hepatitis A. Hepatitis B vaccine  You may need this if you have certain conditions or if you travel or work in places where you may be exposed to hepatitis B. Haemophilus influenzae type b (Hib) vaccine  You may need this if you have certain conditions. You may receive vaccines as individual doses or as more than one vaccine together in one shot (combination vaccines). Talk with your health care provider about the risks and benefits of combination vaccines. What tests do I need?  Blood tests  Lipid and cholesterol levels. These may be checked every 5 years starting at age 41.  Hepatitis C test.  Hepatitis B test. Screening  Diabetes screening. This is done by checking your blood sugar (glucose) after you have not eaten for a while (fasting).  Sexually transmitted disease (STD) testing.  BRCA-related cancer screening. This may be done if you  have a family history of breast, ovarian, tubal, or peritoneal cancers.  Pelvic exam and Pap test. This may be done every 3 years starting at age 108. Starting at age 44, this may be done every 5 years if you have a Pap test in combination with an HPV test. Talk with your health care provider about your test results, treatment  options, and if necessary, the need for more tests. Follow these instructions at home: Eating and drinking   Eat a diet that includes fresh fruits and vegetables, whole grains, lean protein, and low-fat dairy.  Take vitamin and mineral supplements as recommended by your health care provider.  Do not drink alcohol if: ? Your health care provider tells you not to drink. ? You are pregnant, may be pregnant, or are planning to become pregnant.  If you drink alcohol: ? Limit how much you have to 0-1 drink a day. ? Be aware of how much alcohol is in your drink. In the [place] one drink equals one 12 oz bottle of beer (355 mL), one 5 oz glass of wine (148 mL), or one 1 oz glass of hard liquor (44 mL). Lifestyle  Take daily care of your teeth and gums.  Stay active. Exercise for at least 30 minutes on 5 or more days each week.  Do not use any products that contain nicotine or tobacco, such as cigarettes, e-cigarettes, and chewing tobacco. If you need help quitting, ask your health care provider.  If you are sexually active, practice safe sex. Use a condom or other form of birth control (contraception) in order to prevent pregnancy and STIs (sexually transmitted infections). If you plan to become pregnant, see your health care provider for a preconception visit. What's next?  Visit your health care provider once a year for a well check visit.  Ask your health care provider how often you should have your eyes and teeth checked.  Stay up to date on all vaccines. This information is not intended to replace advice given to you by your health care provider. Make sure you discuss any questions you have with your health care provider. Document Revised: 01/22/2018 Document Reviewed: 01/22/2018 Elsevier Patient Education  2020 [employer].

## 2019-08-26 ENCOUNTER — Encounter: Payer: Self-pay | Admitting: Obstetrics & Gynecology

## 2019-08-26 ENCOUNTER — Ambulatory Visit (INDEPENDENT_AMBULATORY_CARE_PROVIDER_SITE_OTHER): Payer: BC Managed Care – PPO | Admitting: Obstetrics & Gynecology

## 2019-08-26 VITALS — BP 128/86 | HR 98 | Wt 219.0 lb

## 2019-08-26 DIAGNOSIS — E282 Polycystic ovarian syndrome: Secondary | ICD-10-CM | POA: Diagnosis not present

## 2019-08-26 DIAGNOSIS — N970 Female infertility associated with anovulation: Secondary | ICD-10-CM

## 2019-08-26 MED ORDER — METFORMIN HCL 1000 MG PO TABS: ORAL_TABLET | ORAL | 5 refills | Status: DC

## 2019-08-26 NOTE — Progress Notes (Signed)
GYNECOLOGY OFFICE VISIT NOTE  History:   Veronica Foster is a 34 y.o. G0P0000 here today for discussion about PCOS and related infertility. Has been off Depo Provera for over a year, resumed normal periods for several months but periods are irregular again.  Interested in being on Metformin for PCOS and also to help with conception efforts. She denies any abnormal vaginal discharge, bleeding, pelvic pain or other concerns.    Past Medical History:  Diagnosis Date  . Chicken pox   . GERD (gastroesophageal reflux disease)   . History of UTI   . Migraine   . Murmur   . PCOS (polycystic ovarian syndrome) 12/30/2014    Past Surgical History:  Procedure Laterality Date  . CHOLECYSTECTOMY OPEN  2014    The following portions of the patient's history were reviewed and updated as appropriate: allergies, current medications, past family history, past medical history, past social history, past surgical history and problem list.   Health Maintenance:  Normal pap and negative HRHPV on 07/14/2018.    Review of Systems:  Pertinent items noted in HPI and remainder of comprehensive ROS otherwise negative.  Physical Exam:  BP 128/86   Pulse 98   Wt 219 lb (99.3 kg)   LMP 08/05/2019 (Exact Date)   BMI 38.79 kg/m  CONSTITUTIONAL: Well-developed, well-nourished female in no acute distress.  HEENT:  Normocephalic, atraumatic. External right and left ear normal. No scleral icterus.  NECK: Normal range of motion, supple, no masses noted on observation SKIN: No rash noted. Not diaphoretic. No erythema. No pallor. MUSCULOSKELETAL: Normal range of motion. No edema noted. NEUROLOGIC: Alert and oriented to person, place, and time. Normal muscle tone coordination. No cranial nerve deficit noted. PSYCHIATRIC: Normal mood and affect. Normal behavior. Normal judgment and thought content. CARDIOVASCULAR: Normal heart rate noted RESPIRATORY: Effort and breath sounds normal, no problems with respiration  noted ABDOMEN: No masses noted. No other overt distention noted.   PELVIC: Deferred     Assessment and Plan:    1. PCOS (polycystic ovarian syndrome) 2. Infertility due to oligo-ovulation Counseled patient about management of PCOS, recommended weight loss which helps with restoring ovulatory cycles, decreases glucose intolerance with improvement of metabolic risk, improves fertility/pregnancy rates and helps with overall health.  Even modest weight loss (5 to 10 percent reduction in body weight) in women with PCOS may result in these effects.    PCOS is also treated with Metformin given its association with glucose intolerance and insulin resistance.  Over 50% of PCOS patients on 1500 mg of Metformin daily have been shown to ovulate successfully. Common side effects include GI intolerance, kidney and liver enzyme irregularities, lactic acidosis.  She will need baseline BUN,Cr, LFTs prior to starting therapy; CMET checked today. HgA1C and TSH also checked.  She will also be instructed to stop therapy if she anticipates major stresses, such as surgery or IVF, in order to avoid lactic acidosis. She was prescribed Metformin 500 mg daily x 1 week, then 1000 mg a day x 1 week, then 1000 mg twice a day. Will continue to follow.  Patient will need repeat CMET check at her follow up visit.   For women with PCOS and BMI ?30 kg/m2, Femara/letrozole can result in higher cumulative live birth rates than Clomid. Some experts now suggest letrozole over clomiphene for ovulation induction in PCOS patients.  When prescribing letrozole, the starting dose is 2.5 mg/day cycle days 3 to 7 following a spontaneous menses or provera-induced bleed. If the  cycle is ovulatory but pregnancy has not occurred, the same dose should be used in the next cycle. If ovulation does not occur, the dose should be increased to 5 mg/day cycle days 3 to 7, with a maximal dose of 7.5 mg/day. Sequential dose escalation of 2.5, 5.0, and 7.5 mg if  ovulation does not occur on lower doses is widely used by reproductive endocrinologists. Higher doses (7.5 mg) appear to be associated with a thinning of the endometrium similar to that seen with clomiphene citrate.  Patient also advised about /ovulation predictor kits,  which can help in knowing when to have frequent intercourse at least every other day around the time of ovulation.  The next steps in evaluation of infertility involve doing a semen analysis then hysterosalpingogram as subsequent evaluation.  Patient was given the option to be referred to a Reproductive Endocrinology and Infertility Specialist now or at any time.  Will start taking multivitamins with folic acid supplement, avoid teratogens. Optimization of other health issues recommended.   Please refer to After Visit Summary for other counseling recommendations.   Return in about 6 months (around 02/25/2020) for Follow up PCOS.    Total face-to-face time with patient: 20 minutes.  Over 50% of encounter was spent on counseling and coordination of care.   Jaynie Collins, MD, FACOG Obstetrician & Gynecologist, [facility] for [employer], [facility]

## 2019-08-26 NOTE — Patient Instructions (Addendum)
Dr. Mont Dutton notes and recommendations:  Counseled patient about management of PCOS, recommended weight loss which helps with restoring ovulatory cycles, decreases glucose intolerance with improvement of metabolic risk, improves fertility/pregnancy rates and helps with overall health.  Even modest weight loss (5 to 10 percent reduction in body weight) in women with PCOS may result in these effects.    PCOS is also treated with Metformin given its association with glucose intolerance and insulin resistance.  Over 50% of PCOS patients on 1500 mg of Metformin daily have been shown to ovulate successfully. Common side effects include GI intolerance, kidney and liver enzyme irregularities, lactic acidosis.  She will need baseline BUN,Cr, LFTs prior to starting therapy; CMET checked today. HgA1C and TSH also checked.  She will also be instructed to stop therapy if she anticipates major stresses, such as surgery or IVF, in order to avoid lactic acidosis. She was prescribed Metformin 500 mg daily x 1 week, then 1000 mg a day x 1 week, then 1000 mg twice a day. Will continue to follow.  Patient will need repeat CMET check at her follow up visit.   For women with PCOS and BMI ?30 kg/m2, Femara/letrozole can result in higher cumulative live birth rates than Clomid. Some experts now suggest letrozole over clomiphene for ovulation induction in PCOS patients.  When prescribing letrozole, the starting dose is 2.5 mg/day cycle days 3 to 7 following a spontaneous menses or provera-induced bleed. If the cycle is ovulatory but pregnancy has not occurred, the same dose should be used in the next cycle. If ovulation does not occur, the dose should be increased to 5 mg/day cycle days 3 to 7, with a maximal dose of 7.5 mg/day. Sequential dose escalation of 2.5, 5.0, and 7.5 mg if ovulation does not occur on lower doses is widely used by reproductive endocrinologists. Higher doses (7.5 mg) appear to be associated with a thinning of  the endometrium similar to that seen with clomiphene citrate.  Patient also advised about /ovulation predictor kits,  which can help in knowing when to have frequent intercourse at least every other day around the time of ovulation.  The next steps in evaluation of infertility involve doing a semen analysis then hysterosalpingogram as subsequent evaluation.  Patient was given the option to be referred to a Reproductive Endocrinology and Infertility Specialist now or at any time.

## 2019-08-31 DIAGNOSIS — N978 Female infertility of other origin: Secondary | ICD-10-CM

## 2019-08-31 DIAGNOSIS — E282 Polycystic ovarian syndrome: Secondary | ICD-10-CM

## 2019-08-31 MED ORDER — MEDROXYPROGESTERONE ACETATE 10 MG PO TABS: 10.0000 mg | ORAL_TABLET | Freq: Every day | ORAL | 6 refills | Status: DC

## 2019-08-31 MED ORDER — LETROZOLE 2.5 MG PO TABS: 2.5000 mg | ORAL_TABLET | Freq: Every day | ORAL | 2 refills | Status: AC

## 2019-08-31 NOTE — Telephone Encounter (Signed)
Please advise 

## 2019-09-06 DIAGNOSIS — F4323 Adjustment disorder with mixed anxiety and depressed mood: Secondary | ICD-10-CM | POA: Diagnosis not present

## 2019-09-20 DIAGNOSIS — F4323 Adjustment disorder with mixed anxiety and depressed mood: Secondary | ICD-10-CM | POA: Diagnosis not present

## 2019-10-04 DIAGNOSIS — F4323 Adjustment disorder with mixed anxiety and depressed mood: Secondary | ICD-10-CM | POA: Diagnosis not present

## 2019-10-18 DIAGNOSIS — F4323 Adjustment disorder with mixed anxiety and depressed mood: Secondary | ICD-10-CM | POA: Diagnosis not present

## 2019-11-01 DIAGNOSIS — F4323 Adjustment disorder with mixed anxiety and depressed mood: Secondary | ICD-10-CM | POA: Diagnosis not present

## 2019-11-09 DIAGNOSIS — F4323 Adjustment disorder with mixed anxiety and depressed mood: Secondary | ICD-10-CM | POA: Diagnosis not present

## 2019-11-15 DIAGNOSIS — F4323 Adjustment disorder with mixed anxiety and depressed mood: Secondary | ICD-10-CM | POA: Diagnosis not present

## 2019-11-18 ENCOUNTER — Other Ambulatory Visit: Payer: Self-pay

## 2019-11-18 ENCOUNTER — Encounter: Payer: Self-pay | Admitting: Primary Care

## 2019-11-18 ENCOUNTER — Ambulatory Visit: Payer: BC Managed Care – PPO | Admitting: Primary Care

## 2019-11-18 DIAGNOSIS — R5383 Other fatigue: Secondary | ICD-10-CM | POA: Insufficient documentation

## 2019-11-18 LAB — HCG, QUANTITATIVE, PREGNANCY: Quantitative HCG: 1 m[IU]/mL

## 2019-11-18 NOTE — Addendum Note (Signed)
Addended by: Doreene Nest on: 11/18/2019 08:08 AM   Modules accepted: Orders

## 2019-11-18 NOTE — Patient Instructions (Signed)
Stop by the lab prior to leaving today. I will notify you of your results once received.   It was a pleasure to see you today!  

## 2019-11-18 NOTE — Progress Notes (Signed)
Subjective:    Patient ID: Veronica Foster, female    DOB: 1986/04/12, 34 y.o.   MRN: [id number]  HPI  This visit occurred during the SARS-CoV-2 public health emergency.  Safety protocols were in place, including screening questions prior to the visit, additional usage of staff PPE, and extensive cleaning of exam room while observing appropriate contact time as indicated for disinfecting solutions.   Veronica Foster is a 34 year old female with a history of hypertension, migraines, PCOS, prediabetes, iron deficiency anemia who presents today with a chief complaint of nausea.   She also reports fatigue, generalized abdominal cramping (mostly lower). She is currently managed on letrozole and provera per her GYN to help with pregnancy. Her symptoms began about one week ago.   She took three pregnancy tests last week which have all been negative. She also tested negative for Covid-19. Her LMP was June 14th and was one day of light spotting, this is different than her normal.   She has breast fullness. She has been trying to conceive. She denies fevers, urinary symptoms, diarrhea. She did vomit a few times last week.   Review of Systems  Constitutional: Negative for fever.  Gastrointestinal: Positive for nausea and vomiting.       Lower abdominal cramping  Genitourinary: Positive for pelvic pain. Negative for dysuria, frequency and vaginal discharge.       Past Medical History:  Diagnosis Date  . Chicken pox   . GERD (gastroesophageal reflux disease)   . History of UTI   . Migraine   . Murmur   . PCOS (polycystic ovarian syndrome) 12/30/2014     Social History   Socioeconomic History  . Marital status: Single    Spouse name: Not on file  . Number of children: Not on file  . Years of education: Not on file  . Highest education level: Not on file  Occupational History  . Not on file  Tobacco Use  . Smoking status: Never Smoker  . Smokeless tobacco: Never Used  Vaping Use  . Vaping  Use: Never used  Substance and Sexual Activity  . Alcohol use: Yes    Alcohol/week: 0.0 standard drinks    Comment: social  . Drug use: No  . Sexual activity: Yes    Birth control/protection: None  Other Topics Concern  . Not on file  Social History Narrative   Single.   Highest level of education is Masters.   Works for [employer] in [employer].   Enjoys traveling, reading, wine tasting.   Social Determinants of Health   Financial Resource Strain:   . Difficulty of Paying Living Expenses:   Food Insecurity:   . Worried About Programme researcher, broadcasting/film/video in the Last Year:   . Barista in the Last Year:   Transportation Needs:   . Freight forwarder (Medical):   Marland Kitchen Lack of Transportation (Non-Medical):   Physical Activity:   . Days of Exercise per Week:   . Minutes of Exercise per Session:   Stress:   . Feeling of Stress :   Social Connections:   . Frequency of Communication with Friends and Family:   . Frequency of Social Gatherings with Friends and Family:   . Attends Religious Services:   . Active Member of Clubs or Organizations:   . Attends Banker Meetings:   Marland Kitchen Marital Status:   Intimate Partner Violence:   . Fear of Current or Ex-Partner:   .  Emotionally Abused:   Marland Kitchen Physically Abused:   . Sexually Abused:     Past Surgical History:  Procedure Laterality Date  . CHOLECYSTECTOMY OPEN  2014    Family History  Problem Relation Age of Onset  . Cancer Mother        colon  . Stroke Mother   . Hypertension Mother   . Arthritis Paternal Aunt   . Hypertension Maternal Grandmother   . Cancer Maternal Grandfather        prostate/pancreatic  . Hypertension Maternal Grandfather   . Arthritis Paternal Grandmother   . Hypertension Paternal Grandmother   . Cancer Paternal Grandfather        prostate  . Heart disease Paternal Grandfather   . Stroke Paternal Grandfather   . Hypertension Paternal Grandfather     No Known  Allergies  Current Outpatient Medications on File Prior to Visit  Medication Sig Dispense Refill  . amLODipine (NORVASC) 10 MG tablet Take 1 tablet (10 mg total) by mouth daily. For blood pressure. 90 tablet 3  . ferrous sulfate 325 (65 FE) MG tablet Take 325 mg by mouth 2 (two) times a week.    . letrozole (FEMARA) 2.5 MG tablet Take 1 tablet (2.5 mg total) by mouth daily. Take on days 3 to 7 following a spontaneous menses or progestin-induced bleed. 5 tablet 2  . medroxyPROGESTERone (PROVERA) 10 MG tablet Take 1 tablet (10 mg total) by mouth daily. Use for ten days as needed to induce bleeding. 10 tablet 6  . metFORMIN (GLUCOPHAGE) 1000 MG tablet Take 1/2 tablet by mouth daily for one week. Then increase to one tablet daily for one week.  Then one tablet twice a day. 60 tablet 5  . rizatriptan (MAXALT) 5 MG tablet Take 1 tablet by mouth at migraine onset. May repeat in 2 hours if needed. Do not exceed 2 tablets in 24 hours. 10 tablet 0  . terbinafine (LAMISIL) 250 MG tablet Take 250 mg by mouth daily.     No current facility-administered medications on file prior to visit.    BP 126/82   Pulse 86   Temp (!) 96.2 F (35.7 C) (Temporal)   Ht 5\' 3"  (1.6 m)   Wt 212 lb 12 oz (96.5 kg)   SpO2 100%   BMI 37.69 kg/m    Objective:   Physical Exam  Cardiovascular: Normal rate and regular rhythm.  GI: Soft. There is no abdominal tenderness.  Musculoskeletal:     Cervical back: Neck supple.  Skin: Skin is warm and dry.  Psychiatric: Mood normal.           Assessment & Plan:

## 2019-11-18 NOTE — Assessment & Plan Note (Signed)
Acute for the last week, also with nausea and abdominal cramping. She is trying to conceive, all three urine pregnancy tests are negative.  Checking HCG quantitative test through blood work. Also checking CBC and TSH.   Exam today overall appears benign.

## 2019-11-24 DIAGNOSIS — F4323 Adjustment disorder with mixed anxiety and depressed mood: Secondary | ICD-10-CM | POA: Diagnosis not present

## 2019-11-30 DIAGNOSIS — L814 Other melanin hyperpigmentation: Secondary | ICD-10-CM | POA: Diagnosis not present

## 2019-11-30 DIAGNOSIS — B353 Tinea pedis: Secondary | ICD-10-CM | POA: Diagnosis not present

## 2019-11-30 DIAGNOSIS — F4323 Adjustment disorder with mixed anxiety and depressed mood: Secondary | ICD-10-CM | POA: Diagnosis not present

## 2019-12-02 ENCOUNTER — Other Ambulatory Visit: Payer: Self-pay | Admitting: Primary Care

## 2019-12-02 ENCOUNTER — Ambulatory Visit: Payer: BC Managed Care – PPO | Admitting: Primary Care

## 2019-12-02 ENCOUNTER — Other Ambulatory Visit: Payer: Self-pay

## 2019-12-02 ENCOUNTER — Encounter: Payer: Self-pay | Admitting: Primary Care

## 2019-12-02 VITALS — BP 132/84 | HR 96 | Temp 96.0°F | Ht 63.0 in | Wt 211.5 lb

## 2019-12-02 DIAGNOSIS — R7303 Prediabetes: Secondary | ICD-10-CM | POA: Diagnosis not present

## 2019-12-02 DIAGNOSIS — D509 Iron deficiency anemia, unspecified: Secondary | ICD-10-CM

## 2019-12-02 DIAGNOSIS — Z1159 Encounter for screening for other viral diseases: Secondary | ICD-10-CM | POA: Diagnosis not present

## 2019-12-02 DIAGNOSIS — Z114 Encounter for screening for human immunodeficiency virus [HIV]: Secondary | ICD-10-CM

## 2019-12-02 DIAGNOSIS — R110 Nausea: Secondary | ICD-10-CM | POA: Diagnosis not present

## 2019-12-02 DIAGNOSIS — R5383 Other fatigue: Secondary | ICD-10-CM

## 2019-12-02 DIAGNOSIS — I10 Essential (primary) hypertension: Secondary | ICD-10-CM | POA: Diagnosis not present

## 2019-12-02 LAB — BASIC METABOLIC PANEL
BUN: 7 mg/dL (ref 6–23)
CO2: 27 mEq/L (ref 19–32)
Calcium: 9.2 mg/dL (ref 8.4–10.5)
Chloride: 104 mEq/L (ref 96–112)
Creatinine, Ser: 0.77 mg/dL (ref 0.40–1.20)
GFR: 103.43 mL/min (ref >60.00)
Glucose, Bld: 95 mg/dL (ref 70–99)
Potassium: 3.5 mEq/L (ref 3.5–5.1)
Sodium: 139 mEq/L (ref 135–145)

## 2019-12-02 LAB — CBC WITH DIFFERENTIAL/PLATELET
Basophils Absolute: 0.1 10*3/uL (ref 0.0–0.1)
Basophils Relative: 0.9 % (ref 0.0–3.0)
Eosinophils Absolute: 0.1 10*3/uL (ref 0.0–0.7)
Eosinophils Relative: 2.1 % (ref 0.0–5.0)
HCT: 38.1 % (ref 36.0–46.0)
Hemoglobin: 12.4 g/dL (ref 12.0–15.0)
Lymphocytes Relative: 45.6 % (ref 12.0–46.0)
Lymphs Abs: 2.8 10*3/uL (ref 0.7–4.0)
MCHC: 32.6 g/dL (ref 30.0–36.0)
MCV: 84.8 fl (ref 78.0–100.0)
Monocytes Absolute: 0.5 10*3/uL (ref 0.1–1.0)
Monocytes Relative: 8.5 % (ref 3.0–12.0)
Neutro Abs: 2.6 10*3/uL (ref 1.4–7.7)
Neutrophils Relative %: 42.9 % — ABNORMAL LOW (ref 43.0–77.0)
Platelets: 391 10*3/uL (ref 150.0–400.0)
RBC: 4.49 Mil/uL (ref 3.87–5.11)
RDW: 14.9 % (ref 11.5–15.5)
WBC: 6.1 10*3/uL (ref 4.0–10.5)

## 2019-12-02 LAB — IBC + FERRITIN
Ferritin: 16.3 ng/mL (ref 10.0–291.0)
Iron: 93 ug/dL (ref 42–145)
Saturation Ratios: 23.6 % (ref 20.0–50.0)
Transferrin: 282 mg/dL (ref 212.0–360.0)

## 2019-12-02 LAB — HCG, QUANTITATIVE, PREGNANCY: Quantitative HCG: <0.6 m[IU]/mL

## 2019-12-02 LAB — TSH: TSH: 2.27 u[IU]/mL (ref 0.35–4.50)

## 2019-12-02 LAB — MONONUCLEOSIS SCREEN: Mono Screen: NEGATIVE

## 2019-12-02 LAB — HEMOGLOBIN A1C: Hgb A1c MFr Bld: 5.6 % (ref 4.6–6.5)

## 2019-12-02 MED ORDER — ONDANSETRON 4 MG PO TBDP: 4.0000 mg | ORAL_TABLET | Freq: Three times a day (TID) | ORAL | 0 refills | Status: DC | PRN

## 2019-12-02 NOTE — Assessment & Plan Note (Signed)
Continued since mid June 2021, last HCG blood pregnancy test questionable, she's had numerous negative UPT.  Repeat HCG blood testing pending. Add mono, CBC, iron studies, TSH, BMP, Lyme panel.  She is s/p cholecystectomy. No other symptoms, no tick bites. Consider antinausea medication, await other lab results.

## 2019-12-02 NOTE — Assessment & Plan Note (Signed)
Last labs stable, repeat iron studies pending.

## 2019-12-02 NOTE — Progress Notes (Signed)
Subjective:    Patient ID: Veronica Foster, female    DOB: Jan 08, 1986, 34 y.o.   MRN: [id number]  HPI  This visit occurred during the SARS-CoV-2 public health emergency.  Safety protocols were in place, including screening questions prior to the visit, additional usage of staff PPE, and extensive cleaning of exam room while observing appropriate contact time as indicated for disinfecting solutions.   Veronica Foster is a 34 year old female with a history of migraines, hypertension, PCOS, prediabetes, iron deficiency anemia, fatigue who presents today with a chief complaint of nausea.  She was evaluated two weeks ago for symptoms of fatigue, generalized abdominal cramping, nausea. She is currently on Femara for pregnancy assistance, had three negative pregnancy tests. During her last visit her HCG blood work was negative for pregnancy.   Since her last visit she's continued to feel nauseated, has had about one episode of vomiting daily when eating. Once last week she had 3-4 episodes of vomiting. She's found that she's nauseated with sugar, had to switch her prenatal vitamins from gummies to a gel cap. Nausea occurs with and without eating, vomiting occurs mostly with eating. Her abdominal cramping has resolved. She does have alternating loose and firm stools, denies liquid diarrhea.   She's no longer on Femara. She's taken another urine pregnancy test two days ago which was negative. She denies changes in food, medications. She denies fevers, vaginal bleeding, tick bites, urinary symptoms. LMP was 11/08/19, lasted one day and was very light.   BP Readings from Last 3 Encounters:  12/02/19 132/84  11/18/19 126/82  08/26/19 128/86     Review of Systems  Constitutional: Positive for fatigue. Negative for chills and fever.  HENT: Negative for rhinorrhea.   Respiratory: Negative for cough.   Gastrointestinal: Negative for abdominal pain.  Genitourinary: Negative for vaginal bleeding.   Musculoskeletal: Negative for arthralgias and myalgias.  Skin: Negative for rash.  Neurological: Negative for headaches.  Psychiatric/Behavioral:       Denies depression       Past Medical History:  Diagnosis Date  . Chicken pox   . GERD (gastroesophageal reflux disease)   . History of UTI   . Migraine   . Murmur   . PCOS (polycystic ovarian syndrome) 12/30/2014     Social History   Socioeconomic History  . Marital status: Single    Spouse name: Not on file  . Number of children: Not on file  . Years of education: Not on file  . Highest education level: Not on file  Occupational History  . Not on file  Tobacco Use  . Smoking status: Never Smoker  . Smokeless tobacco: Never Used  Vaping Use  . Vaping Use: Never used  Substance and Sexual Activity  . Alcohol use: Yes    Alcohol/week: 0.0 standard drinks    Comment: social  . Drug use: No  . Sexual activity: Yes    Birth control/protection: None  Other Topics Concern  . Not on file  Social History Narrative   Single.   Highest level of education is Masters.   Works for [employer] in [employer].   Enjoys traveling, reading, wine tasting.   Social Determinants of Health   Financial Resource Strain:   . Difficulty of Paying Living Expenses:   Food Insecurity:   . Worried About Programme researcher, broadcasting/film/video in the Last Year:   . Barista in the Last Year:   Transportation Needs:   .  Lack of Transportation (Medical):   Marland Kitchen Lack of Transportation (Non-Medical):   Physical Activity:   . Days of Exercise per Week:   . Minutes of Exercise per Session:   Stress:   . Feeling of Stress :   Social Connections:   . Frequency of Communication with Friends and Family:   . Frequency of Social Gatherings with Friends and Family:   . Attends Religious Services:   . Active Member of Clubs or Organizations:   . Attends Banker Meetings:   Marland Kitchen Marital Status:   Intimate Partner Violence:   . Fear of Current  or Ex-Partner:   . Emotionally Abused:   Marland Kitchen Physically Abused:   . Sexually Abused:     Past Surgical History:  Procedure Laterality Date  . CHOLECYSTECTOMY OPEN  2014    Family History  Problem Relation Age of Onset  . Cancer Mother        colon  . Stroke Mother   . Hypertension Mother   . Arthritis Paternal Aunt   . Hypertension Maternal Grandmother   . Cancer Maternal Grandfather        prostate/pancreatic  . Hypertension Maternal Grandfather   . Arthritis Paternal Grandmother   . Hypertension Paternal Grandmother   . Cancer Paternal Grandfather        prostate  . Heart disease Paternal Grandfather   . Stroke Paternal Grandfather   . Hypertension Paternal Grandfather     No Known Allergies  Current Outpatient Medications on File Prior to Visit  Medication Sig Dispense Refill  . amLODipine (NORVASC) 10 MG tablet Take 1 tablet (10 mg total) by mouth daily. For blood pressure. 90 tablet 3  . ferrous sulfate 325 (65 FE) MG tablet Take 325 mg by mouth 2 (two) times a week.    . letrozole (FEMARA) 2.5 MG tablet Take 1 tablet (2.5 mg total) by mouth daily. Take on days 3 to 7 following a spontaneous menses or progestin-induced bleed. 5 tablet 2  . medroxyPROGESTERone (PROVERA) 10 MG tablet Take 1 tablet (10 mg total) by mouth daily. Use for ten days as needed to induce bleeding. 10 tablet 6  . metFORMIN (GLUCOPHAGE) 1000 MG tablet Take 1/2 tablet by mouth daily for one week. Then increase to one tablet daily for one week.  Then one tablet twice a day. 60 tablet 5  . rizatriptan (MAXALT) 5 MG tablet Take 1 tablet by mouth at migraine onset. May repeat in 2 hours if needed. Do not exceed 2 tablets in 24 hours. 10 tablet 0   No current facility-administered medications on file prior to visit.    BP 132/84   Pulse 96   Temp (!) 96 F (35.6 C) (Temporal)   Ht 5\' 3"  (1.6 m)   Wt 211 lb 8 oz (95.9 kg)   SpO2 98%   BMI 37.47 kg/m    Objective:   Physical  Exam Cardiovascular:     Rate and Rhythm: Normal rate and regular rhythm.  Pulmonary:     Effort: Pulmonary effort is normal.     Breath sounds: Normal breath sounds.  Abdominal:     General: Abdomen is flat. Bowel sounds are normal.     Palpations: Abdomen is soft.     Tenderness: There is no abdominal tenderness.  Musculoskeletal:     Cervical back: Neck supple.  Skin:    General: Skin is warm and dry.  Psychiatric:        Mood and Affect:  Mood normal.            Assessment & Plan:

## 2019-12-02 NOTE — Patient Instructions (Addendum)
Stop by the lab prior to leaving today. I will notify you of your results once received.   Be sure to work on hydration with water.   It was a pleasure to see you today!

## 2019-12-02 NOTE — Assessment & Plan Note (Signed)
Continued, also with nausea and vomiting. Checking labs today including TSH, BMP, CBC, Iron studies, Lyme panel, HCG pregnancy, mono test.

## 2019-12-02 NOTE — Assessment & Plan Note (Signed)
Repeat A1C pending. 

## 2019-12-07 DIAGNOSIS — F4323 Adjustment disorder with mixed anxiety and depressed mood: Secondary | ICD-10-CM | POA: Diagnosis not present

## 2019-12-07 LAB — B. BURGDORFI ANTIBODIES BY WB
B burgdorferi IgG Abs (IB): NEGATIVE
B burgdorferi IgM Abs (IB): NEGATIVE
Lyme Disease 18 kD IgG: NONREACTIVE
Lyme Disease 23 kD IgG: NONREACTIVE
Lyme Disease 23 kD IgM: NONREACTIVE
Lyme Disease 28 kD IgG: NONREACTIVE
Lyme Disease 30 kD IgG: NONREACTIVE
Lyme Disease 39 kD IgG: NONREACTIVE
Lyme Disease 39 kD IgM: NONREACTIVE
Lyme Disease 41 kD IgG: REACTIVE — AB
Lyme Disease 41 kD IgM: NONREACTIVE
Lyme Disease 45 kD IgG: REACTIVE — AB
Lyme Disease 58 kD IgG: NONREACTIVE
Lyme Disease 66 kD IgG: NONREACTIVE
Lyme Disease 93 kD IgG: REACTIVE — AB

## 2019-12-07 LAB — HEPATITIS C ANTIBODY
Hepatitis C Ab: NONREACTIVE
SIGNAL TO CUT-OFF: 0.05 (ref <1.00)

## 2019-12-07 LAB — HIV ANTIBODY (ROUTINE TESTING W REFLEX): HIV 1&2 Ab, 4th Generation: NONREACTIVE

## 2019-12-08 ENCOUNTER — Telehealth: Payer: Self-pay

## 2019-12-09 NOTE — Telephone Encounter (Signed)
Called in prescription for pt.

## 2019-12-13 DIAGNOSIS — F4323 Adjustment disorder with mixed anxiety and depressed mood: Secondary | ICD-10-CM | POA: Diagnosis not present

## 2019-12-14 DIAGNOSIS — F4323 Adjustment disorder with mixed anxiety and depressed mood: Secondary | ICD-10-CM | POA: Diagnosis not present

## 2019-12-21 DIAGNOSIS — N978 Female infertility of other origin: Secondary | ICD-10-CM

## 2019-12-21 DIAGNOSIS — F4323 Adjustment disorder with mixed anxiety and depressed mood: Secondary | ICD-10-CM | POA: Diagnosis not present

## 2019-12-21 DIAGNOSIS — E282 Polycystic ovarian syndrome: Secondary | ICD-10-CM

## 2019-12-23 ENCOUNTER — Encounter: Payer: Self-pay | Admitting: *Deleted

## 2019-12-27 DIAGNOSIS — F4323 Adjustment disorder with mixed anxiety and depressed mood: Secondary | ICD-10-CM | POA: Diagnosis not present

## 2019-12-28 DIAGNOSIS — F4323 Adjustment disorder with mixed anxiety and depressed mood: Secondary | ICD-10-CM | POA: Diagnosis not present

## 2020-01-04 DIAGNOSIS — F4323 Adjustment disorder with mixed anxiety and depressed mood: Secondary | ICD-10-CM | POA: Diagnosis not present

## 2020-01-10 DIAGNOSIS — F4323 Adjustment disorder with mixed anxiety and depressed mood: Secondary | ICD-10-CM | POA: Diagnosis not present

## 2020-01-11 DIAGNOSIS — F4323 Adjustment disorder with mixed anxiety and depressed mood: Secondary | ICD-10-CM | POA: Diagnosis not present

## 2020-01-24 DIAGNOSIS — F4323 Adjustment disorder with mixed anxiety and depressed mood: Secondary | ICD-10-CM | POA: Diagnosis not present

## 2020-01-25 DIAGNOSIS — F4323 Adjustment disorder with mixed anxiety and depressed mood: Secondary | ICD-10-CM | POA: Diagnosis not present

## 2020-01-30 IMAGING — US US PELVIS COMPLETE WITH TRANSVAGINAL
1 series · 13 of 25 positions shown · non-contrast
Comparison: 12/15/2014

CLINICAL DATA: Abnormal uterine bleeding for 4 months

EXAM:
TRANSABDOMINAL AND TRANSVAGINAL ULTRASOUND OF PELVIS
TECHNIQUE: Both transabdominal and transvaginal ultrasound examinations of the
pelvis were performed. Transabdominal technique was performed for
global imaging of the pelvis including uterus, ovaries, adnexal
regions, and pelvic cul-de-sac. It was necessary to proceed with
endovaginal exam following the transabdominal exam to visualize the
uterus endometrium ovaries.

[Series 1: us pelvis complete with transvaginal · 0.24mm/px · 133 acquisitions, 13 frames shown]
[im 1/133]
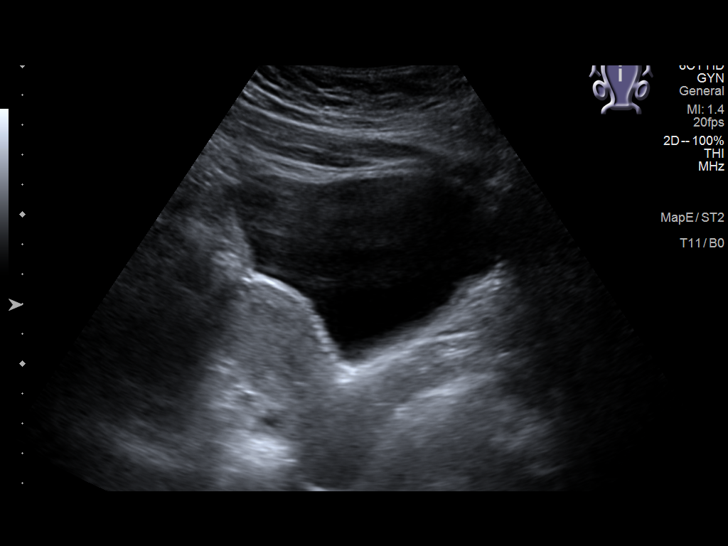
[im 12/133]
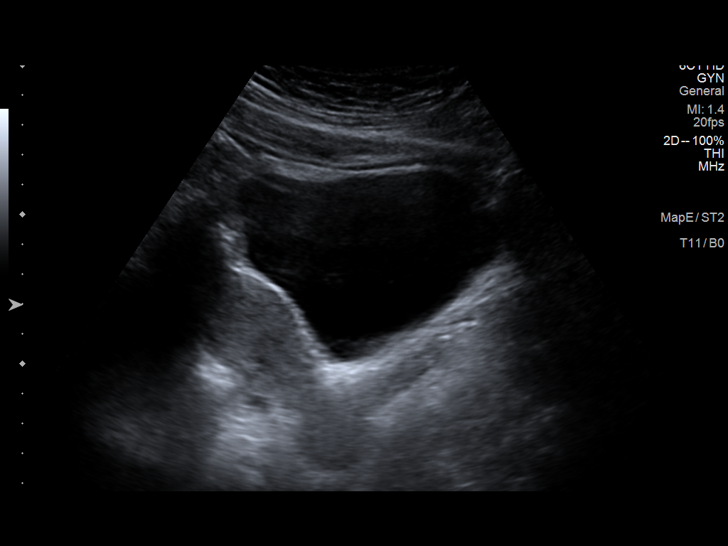
[im 23/133]
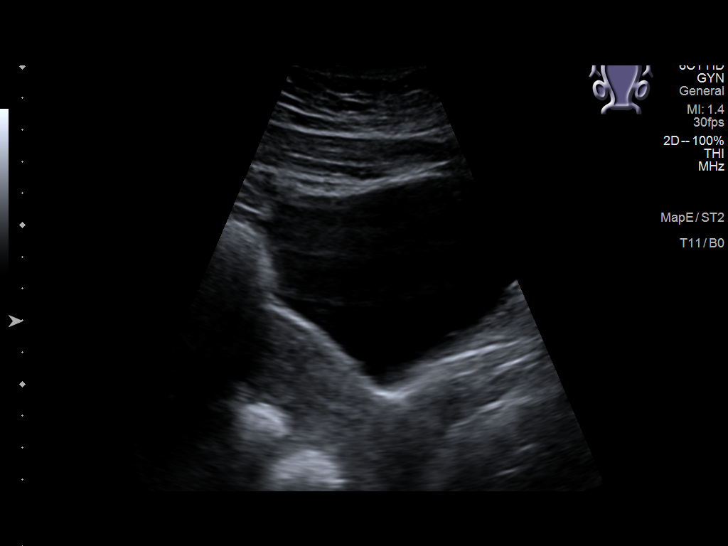
[im 34/133]
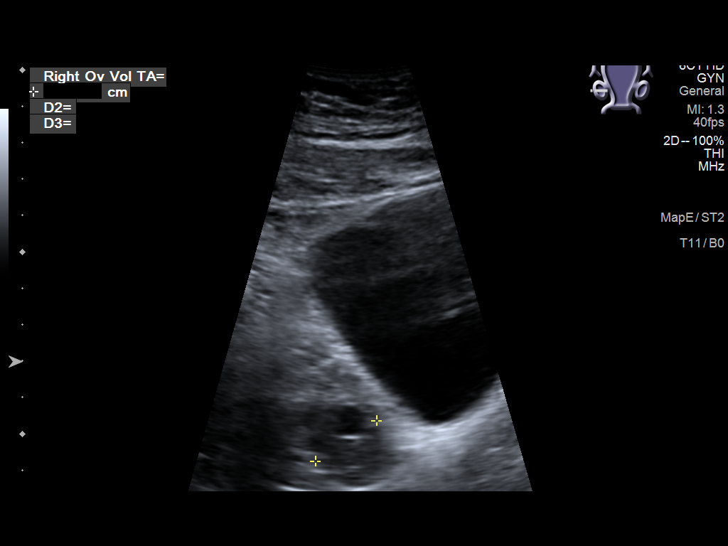
[im 45/133]
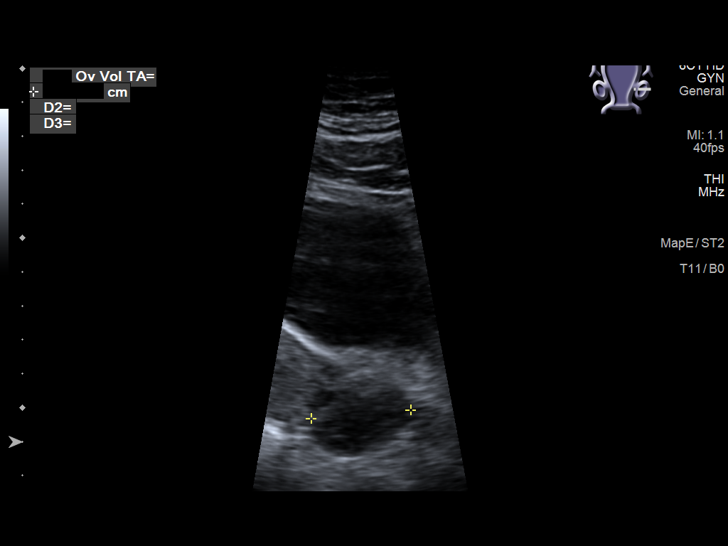
[im 56/133]
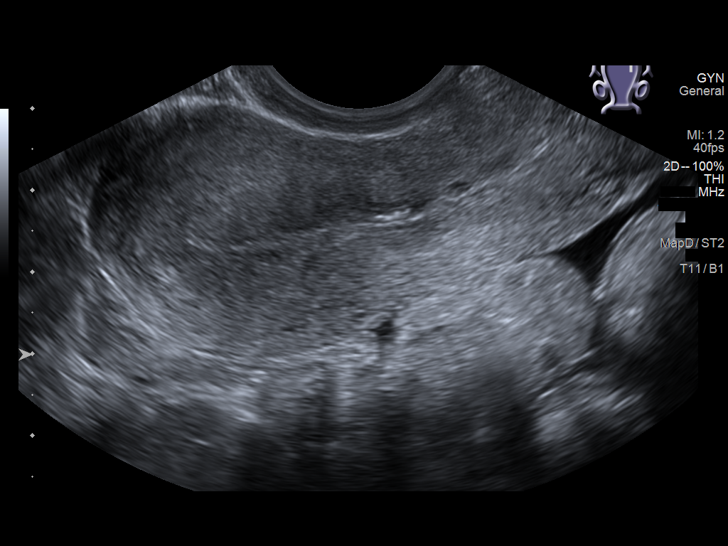
[im 67/133]
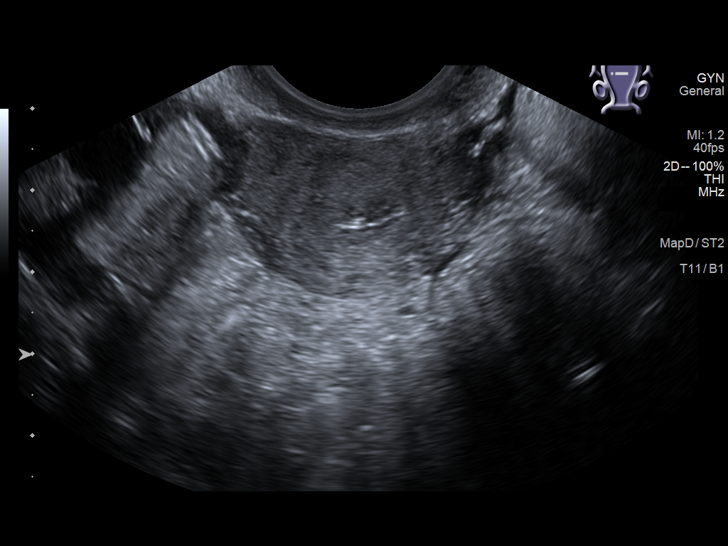
[im 78/133]
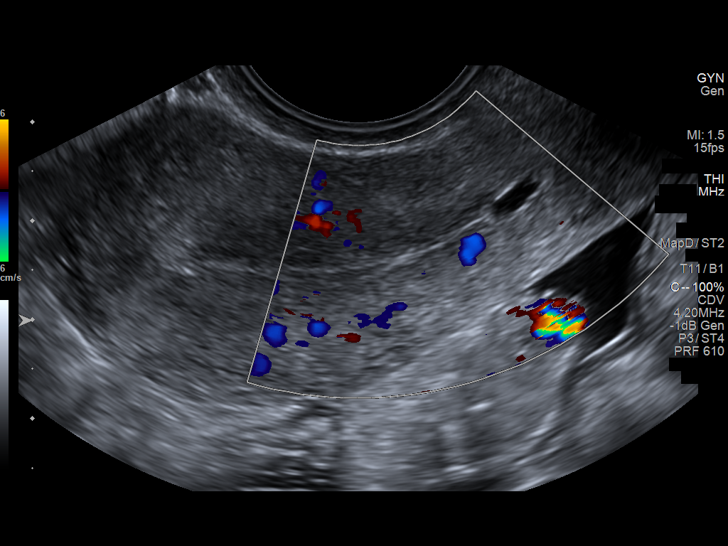
[im 89/133]
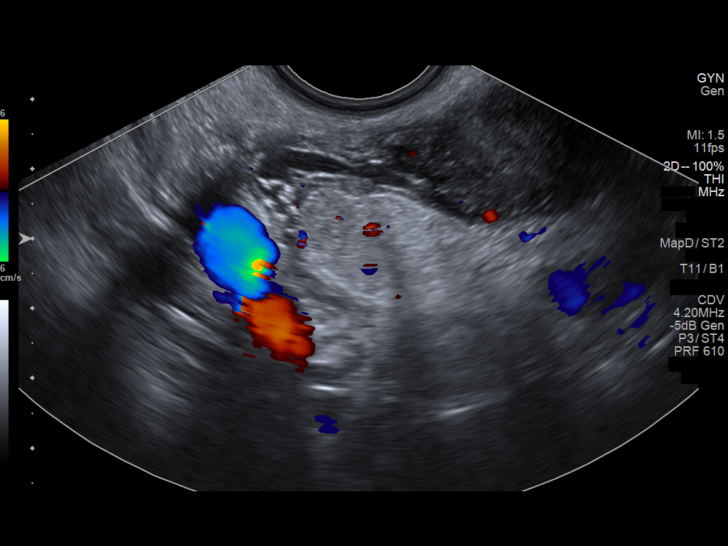
[im 100/133]
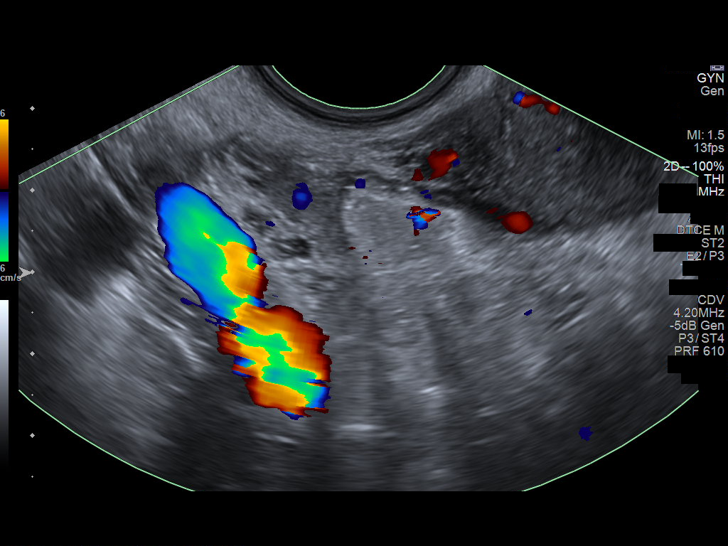
[im 111/133]
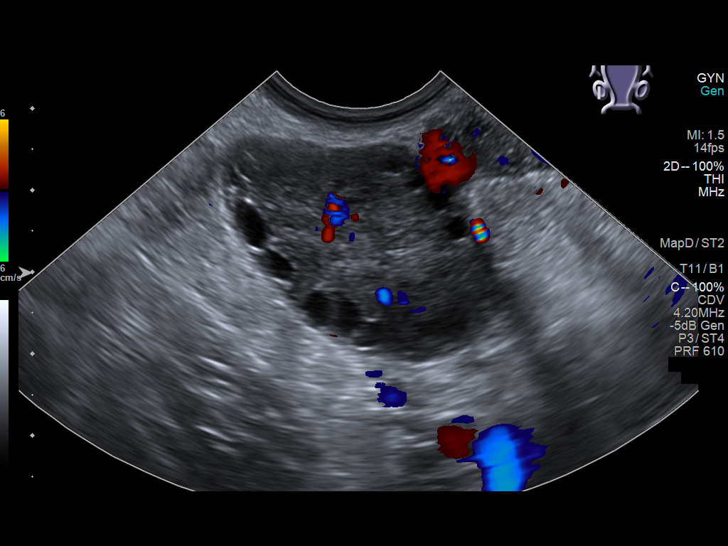
[im 122/133]
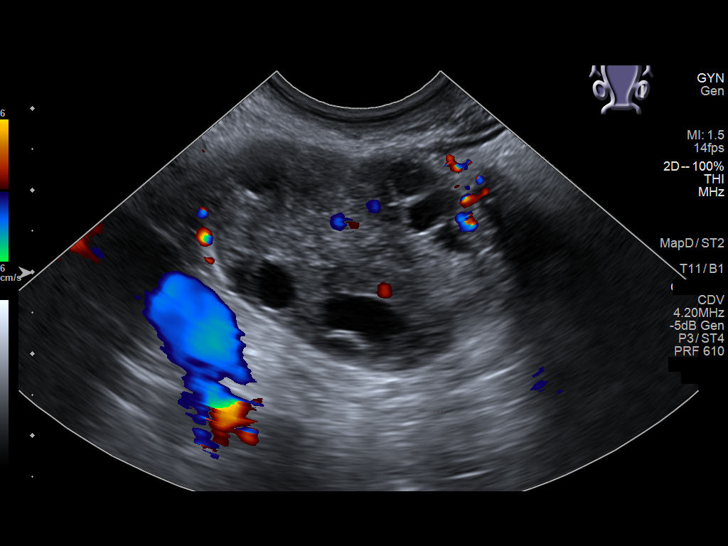
[im 133/133]
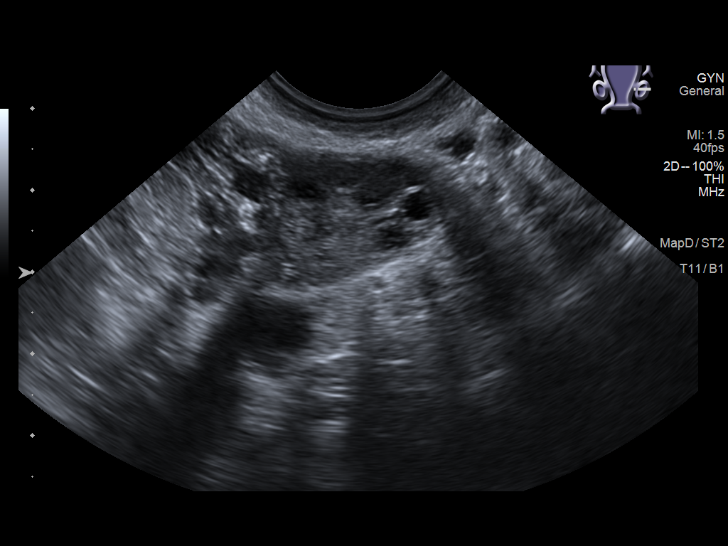

[13 of 25 positions shown; findings below may reference images not displayed]

FINDINGS: Uterus

Measurements: 7.7 x 3.2 x 4.1 cm = volume: 53 mL. No focal mass
lesion

Endometrium

Thickness: 1.8 mm.  5 mm small endometrial cyst.

Right ovary

Measurements: 4 x 2.4 x 3.4 cm = volume: 17.1 mL. Numerous
follicles.

Left ovary

Measurements: 3.6 x 2.5 x 2.8 cm = volume: 13 mL. Numerous
follicles.

Other findings

Trace free fluid
IMPRESSION: 1. Endometrial thickness of 1.8 mm. If bleeding remains unresponsive
to hormonal or medical therapy, sonohysterogram should be considered
for focal lesion work-up. (Ref: Radiological Reasoning: Algorithmic
Workup of Abnormal Vaginal Bleeding with Endovaginal Sonography and
Sonohysterography. [id number]; [id number])
2. Normal ovarian size but with multiple follicles within both
ovaries suggesting polycystic ovaries.
3. Trace free fluid

## 2020-02-01 DIAGNOSIS — F4323 Adjustment disorder with mixed anxiety and depressed mood: Secondary | ICD-10-CM | POA: Diagnosis not present

## 2020-02-03 DIAGNOSIS — Z20828 Contact with and (suspected) exposure to other viral communicable diseases: Secondary | ICD-10-CM | POA: Diagnosis not present

## 2020-02-04 ENCOUNTER — Encounter: Payer: Self-pay | Admitting: Radiology

## 2020-02-07 DIAGNOSIS — F4321 Adjustment disorder with depressed mood: Secondary | ICD-10-CM | POA: Diagnosis not present

## 2020-02-08 DIAGNOSIS — F4323 Adjustment disorder with mixed anxiety and depressed mood: Secondary | ICD-10-CM | POA: Diagnosis not present

## 2020-02-15 DIAGNOSIS — F4323 Adjustment disorder with mixed anxiety and depressed mood: Secondary | ICD-10-CM | POA: Diagnosis not present

## 2020-02-21 DIAGNOSIS — F4323 Adjustment disorder with mixed anxiety and depressed mood: Secondary | ICD-10-CM | POA: Diagnosis not present

## 2020-02-21 DIAGNOSIS — F4321 Adjustment disorder with depressed mood: Secondary | ICD-10-CM | POA: Diagnosis not present

## 2020-02-22 DIAGNOSIS — F4321 Adjustment disorder with depressed mood: Secondary | ICD-10-CM | POA: Diagnosis not present

## 2020-03-06 DIAGNOSIS — F4323 Adjustment disorder with mixed anxiety and depressed mood: Secondary | ICD-10-CM | POA: Diagnosis not present

## 2020-03-07 DIAGNOSIS — F4321 Adjustment disorder with depressed mood: Secondary | ICD-10-CM | POA: Diagnosis not present

## 2020-03-07 DIAGNOSIS — F4323 Adjustment disorder with mixed anxiety and depressed mood: Secondary | ICD-10-CM | POA: Diagnosis not present

## 2020-03-14 DIAGNOSIS — F4321 Adjustment disorder with depressed mood: Secondary | ICD-10-CM | POA: Diagnosis not present

## 2020-03-14 DIAGNOSIS — F4323 Adjustment disorder with mixed anxiety and depressed mood: Secondary | ICD-10-CM | POA: Diagnosis not present

## 2020-03-20 DIAGNOSIS — F4323 Adjustment disorder with mixed anxiety and depressed mood: Secondary | ICD-10-CM | POA: Diagnosis not present

## 2020-03-28 DIAGNOSIS — F4321 Adjustment disorder with depressed mood: Secondary | ICD-10-CM | POA: Diagnosis not present

## 2020-03-29 DIAGNOSIS — E288 Other ovarian dysfunction: Secondary | ICD-10-CM | POA: Diagnosis not present

## 2020-03-29 DIAGNOSIS — E282 Polycystic ovarian syndrome: Secondary | ICD-10-CM | POA: Diagnosis not present

## 2020-03-29 DIAGNOSIS — Z319 Encounter for procreative management, unspecified: Secondary | ICD-10-CM | POA: Diagnosis not present

## 2020-04-03 DIAGNOSIS — F4323 Adjustment disorder with mixed anxiety and depressed mood: Secondary | ICD-10-CM | POA: Diagnosis not present

## 2020-04-11 DIAGNOSIS — F4321 Adjustment disorder with depressed mood: Secondary | ICD-10-CM | POA: Diagnosis not present

## 2020-04-17 DIAGNOSIS — F4323 Adjustment disorder with mixed anxiety and depressed mood: Secondary | ICD-10-CM | POA: Diagnosis not present

## 2020-04-25 DIAGNOSIS — F4321 Adjustment disorder with depressed mood: Secondary | ICD-10-CM | POA: Diagnosis not present

## 2020-05-01 DIAGNOSIS — F4323 Adjustment disorder with mixed anxiety and depressed mood: Secondary | ICD-10-CM | POA: Diagnosis not present

## 2020-05-02 DIAGNOSIS — F4321 Adjustment disorder with depressed mood: Secondary | ICD-10-CM | POA: Diagnosis not present

## 2020-05-08 DIAGNOSIS — F4321 Adjustment disorder with depressed mood: Secondary | ICD-10-CM | POA: Diagnosis not present

## 2020-05-15 DIAGNOSIS — F4323 Adjustment disorder with mixed anxiety and depressed mood: Secondary | ICD-10-CM | POA: Diagnosis not present

## 2020-05-17 DIAGNOSIS — F4321 Adjustment disorder with depressed mood: Secondary | ICD-10-CM | POA: Diagnosis not present

## 2020-05-23 DIAGNOSIS — F4321 Adjustment disorder with depressed mood: Secondary | ICD-10-CM | POA: Diagnosis not present

## 2020-05-29 DIAGNOSIS — F4323 Adjustment disorder with mixed anxiety and depressed mood: Secondary | ICD-10-CM | POA: Diagnosis not present

## 2020-05-30 DIAGNOSIS — F4321 Adjustment disorder with depressed mood: Secondary | ICD-10-CM | POA: Diagnosis not present

## 2020-06-07 DIAGNOSIS — F4321 Adjustment disorder with depressed mood: Secondary | ICD-10-CM | POA: Diagnosis not present

## 2020-06-13 DIAGNOSIS — F4323 Adjustment disorder with mixed anxiety and depressed mood: Secondary | ICD-10-CM | POA: Diagnosis not present

## 2020-06-16 DIAGNOSIS — Z319 Encounter for procreative management, unspecified: Secondary | ICD-10-CM | POA: Diagnosis not present

## 2020-06-16 DIAGNOSIS — E282 Polycystic ovarian syndrome: Secondary | ICD-10-CM | POA: Diagnosis not present

## 2020-06-20 DIAGNOSIS — F4321 Adjustment disorder with depressed mood: Secondary | ICD-10-CM | POA: Diagnosis not present

## 2020-06-27 DIAGNOSIS — F4323 Adjustment disorder with mixed anxiety and depressed mood: Secondary | ICD-10-CM | POA: Diagnosis not present

## 2020-07-04 DIAGNOSIS — F4321 Adjustment disorder with depressed mood: Secondary | ICD-10-CM | POA: Diagnosis not present

## 2020-07-11 DIAGNOSIS — F4323 Adjustment disorder with mixed anxiety and depressed mood: Secondary | ICD-10-CM | POA: Diagnosis not present

## 2020-07-17 DIAGNOSIS — F4321 Adjustment disorder with depressed mood: Secondary | ICD-10-CM | POA: Diagnosis not present

## 2020-07-25 DIAGNOSIS — F4323 Adjustment disorder with mixed anxiety and depressed mood: Secondary | ICD-10-CM | POA: Diagnosis not present

## 2020-08-03 DIAGNOSIS — F4321 Adjustment disorder with depressed mood: Secondary | ICD-10-CM | POA: Diagnosis not present

## 2020-08-10 DIAGNOSIS — F4323 Adjustment disorder with mixed anxiety and depressed mood: Secondary | ICD-10-CM | POA: Diagnosis not present

## 2020-08-15 DIAGNOSIS — F4321 Adjustment disorder with depressed mood: Secondary | ICD-10-CM | POA: Diagnosis not present

## 2020-08-22 DIAGNOSIS — F4323 Adjustment disorder with mixed anxiety and depressed mood: Secondary | ICD-10-CM | POA: Diagnosis not present

## 2020-08-29 DIAGNOSIS — F4321 Adjustment disorder with depressed mood: Secondary | ICD-10-CM | POA: Diagnosis not present

## 2020-09-05 DIAGNOSIS — F4323 Adjustment disorder with mixed anxiety and depressed mood: Secondary | ICD-10-CM | POA: Diagnosis not present

## 2020-09-12 DIAGNOSIS — F4321 Adjustment disorder with depressed mood: Secondary | ICD-10-CM | POA: Diagnosis not present

## 2020-09-19 DIAGNOSIS — F4323 Adjustment disorder with mixed anxiety and depressed mood: Secondary | ICD-10-CM | POA: Diagnosis not present

## 2020-09-26 DIAGNOSIS — F4321 Adjustment disorder with depressed mood: Secondary | ICD-10-CM | POA: Diagnosis not present

## 2020-10-04 DIAGNOSIS — F4323 Adjustment disorder with mixed anxiety and depressed mood: Secondary | ICD-10-CM | POA: Diagnosis not present

## 2020-10-10 DIAGNOSIS — F4321 Adjustment disorder with depressed mood: Secondary | ICD-10-CM | POA: Diagnosis not present

## 2020-10-17 DIAGNOSIS — F4323 Adjustment disorder with mixed anxiety and depressed mood: Secondary | ICD-10-CM | POA: Diagnosis not present

## 2020-10-24 DIAGNOSIS — F4321 Adjustment disorder with depressed mood: Secondary | ICD-10-CM | POA: Diagnosis not present

## 2020-10-28 DIAGNOSIS — Z20822 Contact with and (suspected) exposure to covid-19: Secondary | ICD-10-CM | POA: Diagnosis not present

## 2020-10-28 DIAGNOSIS — Z03818 Encounter for observation for suspected exposure to other biological agents ruled out: Secondary | ICD-10-CM | POA: Diagnosis not present

## 2020-10-31 DIAGNOSIS — F4323 Adjustment disorder with mixed anxiety and depressed mood: Secondary | ICD-10-CM | POA: Diagnosis not present

## 2020-11-01 ENCOUNTER — Ambulatory Visit: Payer: BC Managed Care – PPO | Admitting: Primary Care

## 2020-11-07 DIAGNOSIS — F4321 Adjustment disorder with depressed mood: Secondary | ICD-10-CM | POA: Diagnosis not present

## 2020-11-17 ENCOUNTER — Telehealth: Payer: BC Managed Care – PPO | Admitting: Physician Assistant

## 2020-11-17 ENCOUNTER — Encounter: Payer: Self-pay | Admitting: Physician Assistant

## 2020-11-17 DIAGNOSIS — U071 COVID-19: Secondary | ICD-10-CM

## 2020-11-17 DIAGNOSIS — F4323 Adjustment disorder with mixed anxiety and depressed mood: Secondary | ICD-10-CM | POA: Diagnosis not present

## 2020-11-17 MED ORDER — MOLNUPIRAVIR EUA 200MG CAPSULE: 4.0000 | ORAL_CAPSULE | Freq: Two times a day (BID) | ORAL | 0 refills | Status: AC

## 2020-11-17 NOTE — Patient Instructions (Signed)
Hello Veronica Foster,  You are being placed in the home monitoring program for COVID-19 (commonly known as Coronavirus).  This is because you are suspected to have the virus or are known to have the virus.  If you are unsure which group you fall into call your clinic.    As part of this program, you'll answer a daily questionnaire in the MyChart mobile app. You'll receive a notification through the MyChart app when the questionnaire is available. When you log in to MyChart, you'll see the tasks in your To Do activity.       Clinicians will see any answers that are concerning and take appropriate steps.  If at any point you are having a medical emergency, call 911.  If otherwise concerned call your clinic instead of coming into the clinic or hospital.  To keep from spreading the disease you should: Stay home and limit contact with other people as much as possible.  Wash your hands frequently. Cover your coughs and sneezes with a tissue, and throw used tissues in the trash.   Clean and disinfect frequently touched surfaces and objects.    Take care of yourself by: Staying home Resting Drinking fluids Take fever-reducing medications (Tylenol/Acetaminophen and Ibuprofen)  For more information on the disease go to the Centers for Disease Control and Prevention website     You are being prescribed MOLNUPIRAVIR for COVID-19 infection.   Please pick up your prescription at: [facility] pharmacy called into [place], [place]   Please call the pharmacy or go through the drive through vs going inside if you are picking up the mediation yourself to prevent further spread. If prescribed to a [facility] affiliated pharmacy, a pharmacist will bring the medication out to your car.   ADMINISTRATION INSTRUCTIONS: Take with or without food. Swallow the tablets whole. Don't chew, crush, or break the medications because it might not work as well  For each dose of the medication, you should be taking FOUR tablets  at one time, TWICE a day   Finish your full five-day course of Molnupiravir even if you feel better before you're done. Stopping this medication too early can make it less effective to prevent severe illness related to COVID19.    Molnupiravir is prescribed for YOU ONLY. Don't share it with others, even if they have similar symptoms as you. This medication might not be right for everyone.   Make sure to take steps to protect yourself and others while you're taking this medication in order to get well soon and to prevent others from getting sick with COVID-19.   **If you are of childbearing potential (any gender) - it is advised to not get pregnant while taking this medication and recommended that condoms are used for female partners the next 3 months after taking the medication out of extreme caution    COMMON SIDE EFFECTS: Diarrhea Nausea  Dizziness    If your COVID-19 symptoms get worse, get medical help right away. Call 911 if you experience symptoms such as worsening cough, trouble breathing, chest pain that doesn't go away, confusion, a hard time staying awake, and pale or blue-colored skin. This medication won't prevent all COVID-19 cases from getting worse.   Can take to lessen severity: Vit C 500mg  twice daily Quercertin 250-500mg  twice daily Zinc 75-100mg  daily Melatonin 3-6 mg at bedtime Vit D3 1000-2000 IU daily Aspirin 81 mg daily with food Optional: Famotidine 20mg  daily Also can add tylenol/ibuprofen as needed for fevers and body aches May add  Mucinex or Mucinex DM as needed for cough/congestion  10 Things You Can Do to Manage Your COVID-19 Symptoms at Home If you have possible or confirmed COVID-19 Stay home except to get medical care. Monitor your symptoms carefully. If your symptoms get worse, call your healthcare provider immediately. Get rest and stay hydrated. If you have a medical appointment, call the healthcare provider ahead of time and tell them that you  have or may have COVID-19. For medical emergencies, call 911 and notify the dispatch personnel that you have or may have COVID-19. Cover your cough and sneezes with a tissue or use the inside of your elbow. Wash your hands often with soap and water for at least 20 seconds or clean your hands with an alcohol-based hand sanitizer that contains at least 60% alcohol. As much as possible, stay in a specific room and away from other people in your home. Also, you should use a separate bathroom, if available. If you need to be around other people in or outside of the home, wear a mask. Avoid sharing personal items with other people in your household, like dishes, towels, and bedding. Clean all surfaces that are touched often, like counters, tabletops, and doorknobs. Use household cleaning sprays or wipes according to the label instructions. [email or web address] 12/10/2019 This information is not intended to replace advice given to you by your health care provider. Make sure you discuss any questions you have with your healthcare provider. Document Revised: 06/30/2020 Document Reviewed: 06/30/2020 Elsevier Patient Education  2022 Elsevier Inc.   COVID-19: What to Do if You Are Sick CDC has updated isolation and quarantine recommendations for the public, and is revising the CDC website to reflect these changes. These recommendations do not apply to healthcare personnel and do not supersede state, local, tribal, or territorial laws, rules, andregulations. If you have a fever, cough or other symptoms, you might have COVID-19. Most people have mild illness and are able to recover at home. If you are sick: Keep track of your symptoms. If you have an emergency warning sign (including trouble breathing), call 911. Steps to help prevent the spread of COVID-19 if you are sick If you are sick with COVID-19 or think you might have COVID-19, follow the steps below to care for yourself and to help protect other  peoplein your home and community. Stay home except to get medical care Stay home. Most people with COVID-19 have mild illness and can recover at home without medical care. Do not leave your home, except to get medical care. Do not visit public areas. Take care of yourself. Get rest and stay hydrated. Take over-the-counter medicines, such as acetaminophen, to help you feel better. Stay in touch with your doctor. Call before you get medical care. Be sure to get care if you have trouble breathing, or have any other emergency warning signs, or if you think it is an emergency. Avoid public transportation, ride-sharing, or taxis. Separate yourself from other people As much as possible, stay in a specific room and away from other people and pets in your home. If possible, you should use a separate bathroom. If you need to be around other people or animals in oroutside of the home, wear a mask. Tell your close contactsthat they may have been exposed to COVID-19. An infected person can spread COVID-19 starting 48 hours (or 2 days) before the person has any symptoms or tests positive. By letting your close contacts know they may have been exposed to COVID-19, you are  helping to protect everyone. Additional guidance is available for those living in close quarters and shared housing. See COVID-19 and Animals if you have questions about pets. If you are diagnosed with COVID-19, someone from the health department may call you. Answer the call to slow the spread. Monitor your symptoms Symptoms of COVID-19 include fever, cough, or other symptoms. Follow care instructions from your healthcare provider and local health department. Your local health authorities may give instructions on checking your symptoms and reporting information. When to seek emergency medical attention Look for emergency warning signs* for COVID-19. If someone is showing any of these signs, seek emergency medical care immediately: Trouble  breathing Persistent pain or pressure in the chest New confusion Inability to wake or stay awake Pale, gray, or blue-colored skin, lips, or nail beds, depending on skin tone *This list is not all possible symptoms. Please call your medical provider forany other symptoms that are severe or concerning to you. Call 911 or call ahead to your local emergency facility: Notify the operator that you are seeking care for someone who has or may haveCOVID-19. Call ahead before visiting your doctor Call ahead. Many medical visits for routine care are being postponed or done by phone or telemedicine. If you have a medical appointment that cannot be postponed, call your doctor's office, and tell them you have or may have COVID-19. This will help the office protect themselves and other patients. Get tested If you have symptoms of COVID-19, get tested. While waiting for test results, you stay away from others, including staying apart from those living in your household. Self-tests are one of several options for testing for the virus that causes COVID-19 and may be more convenient than laboratory-based tests and point-of-care tests. Ask your healthcare provider or your local health department if you need help interpreting your test results. You can visit your state, tribal, local, and territorial health department's website to look for the latest local information on testing sites. If you are sick, wear a mask over your nose and mouth You should wear a mask over your nose and mouth if you must be around other people or animals, including pets (even at home). You don't need to wear the mask if you are alone. If you can't put on a mask (because of trouble breathing, for example), cover your coughs and sneezes in some other way. Try to stay at least 6 feet away from other people. This will help protect the people around you. Masks should not be placed on young children under age 76 years, anyone who has trouble  breathing, or anyone who is not able to remove the mask without help. Note: During the COVID-19 pandemic, medical grade facemasks are reserved forhealthcare workers and some first responders. Cover your coughs and sneezes Cover your mouth and nose with a tissue when you cough or sneeze. Throw away used tissues in a lined trash can. Immediately wash your hands with soap and water for at least 20 seconds. If soap and water are not available, clean your hands with an alcohol-based hand sanitizer that contains at least 60% alcohol. Clean your hands often Wash your hands often with soap and water for at least 20 seconds. This is especially important after blowing your nose, coughing, or sneezing; going to the bathroom; and before eating or preparing food. Use hand sanitizer if soap and water are not available. Use an alcohol-based hand sanitizer with at least 60% alcohol, covering all surfaces of your hands and rubbing them  together until they feel dry. Soap and water are the best option, especially if hands are visibly dirty. Avoid touching your eyes, nose, and mouth with unwashed hands. Handwashing Tips Avoid sharing personal household items Do not share dishes, drinking glasses, cups, eating utensils, towels, or bedding with other people in your home. Wash these items thoroughly after using them with soap and water or put in the dishwasher. Clean all "high-touch" surfaces every day Clean and disinfect high-touch surfaces in your "sick room" and bathroom; wear disposable gloves. Let someone else clean and disinfect surfaces in common areas, but you should clean your bedroom and bathroom, if possible. If a caregiver or other person needs to clean and disinfect a sick person's bedroom or bathroom, they should do so on an as-needed basis. The caregiver/other person should wear a mask and disposable gloves prior to cleaning. They should wait as long as possible after the person who is sick has used the  bathroom before coming in to clean and use the bathroom. High-touch surfaces include phones, remote controls, counters, tabletops, doorknobs, bathroom fixtures, toilets, keyboards, tablets, and bedside tables. Clean and disinfect areas that may have blood, stool, or body fluids on them. Use household cleaners and disinfectants. Clean the area or item with soap and water or another detergent if it is dirty. Then, use a household disinfectant. Be sure to follow the instructions on the label to ensure safe and effective use of the product. Many products recommend keeping the surface wet for several minutes to ensure germs are killed. Many also recommend precautions such as wearing gloves and making sure you have good ventilation during use of the product. Use a product from [employer] List N: Disinfectants for Coronavirus (UJWJX-91). Complete Disinfection Guidance When you can be around others after being sick with COVID-19 Deciding when you can be around others is different for different situations. Find out when you can safely end home isolation. For any additional questions about your care,contact your healthcare provider or state or local health department. 05/03/2020 Content source: [facility] for Immunization and Respiratory Diseases ([facility]), Division of Viral Diseases This information is not intended to replace advice given to you by your health care provider. Make sure you discuss any questions you have with your healthcare provider. Document Revised: 06/30/2020 Document Reviewed: 06/30/2020 Elsevier Patient Education  [place].

## 2020-11-17 NOTE — Progress Notes (Signed)
[name], Foster are scheduled for a virtual visit with your provider today.    Just as we do with appointments in the office, we must obtain your consent to participate.  Your consent will be active for this visit and any virtual visit you may have with one of our providers in the next 365 days.    If you have a MyChart account, I can also send a copy of this consent to you electronically.  All virtual visits are billed to your insurance company just like a traditional visit in the office.  As this is a virtual visit, video technology does not allow for your provider to perform a traditional examination.  This may limit your provider's ability to fully assess your condition.  If your provider identifies any concerns that need to be evaluated in person or the need to arrange testing such as labs, EKG, etc, we will make arrangements to do so.    Although advances in technology are sophisticated, we cannot ensure that it will always work on either your end or our end.  If the connection with a video visit is poor, we may have to switch to a telephone visit.  With either a video or telephone visit, we are not always able to ensure that we have a secure connection.   I need to obtain your verbal consent now.   Are you willing to proceed with your visit today?   Veronica Foster has provided verbal consent on 11/17/2020 for a virtual visit (video or telephone).   Veronica Loveless, PA-C 11/17/2020  12:13 PM  Virtual Visit Consent   Veronica Foster, you are scheduled for a virtual visit with a [facility] provider today.     Just as with appointments in the office, your consent must be obtained to participate.  Your consent will be active for this visit and any virtual visit you may have with one of our providers in the next 365 days.     If you have a MyChart account, a copy of this consent can be sent to you electronically.  All virtual visits are billed to your insurance company just like a traditional  visit in the office.    As this is a virtual visit, video technology does not allow for your provider to perform a traditional examination.  This may limit your provider's ability to fully assess your condition.  If your provider identifies any concerns that need to be evaluated in person or the need to arrange testing (such as labs, EKG, etc.), we will make arrangements to do so.     Although advances in technology are sophisticated, we cannot ensure that it will always work on either your end or our end.  If the connection with a video visit is poor, the visit may have to be switched to a telephone visit.  With either a video or telephone visit, we are not always able to ensure that we have a secure connection.     I need to obtain your verbal consent now.   Are you willing to proceed with your visit today?    Veronica Foster has provided verbal consent on 11/17/2020 for a virtual visit (video or telephone).   Veronica Loveless, PA-C   Date: 11/17/2020 12:13 PM   Virtual Visit via Video Note   Veronica Foster, connected [email or web address]@ ([id number], 08/27/1985) on 11/17/20 at 12:15 PM EDT by a video-enabled telemedicine application and verified that I am speaking with the correct  person using two identifiers.  Location: Patient: Virtual Visit Location Patient: Home Provider: Virtual Visit Location Provider: Home Office   I discussed the limitations of evaluation and management by telemedicine and the availability of in person appointments. The patient expressed understanding and agreed to proceed.    History of Present Illness: Veronica Foster is a 35 y.o. who identifies as a female who was assigned female at birth, and is being seen today for Covid 19, tested positive yesterday, 11/16/20 .  HPI: URI  This is a new problem. The current episode started in the past 7 days (symptoms started on Tuesday). The problem has been gradually improving. There has been no fever. Associated symptoms  include congestion, headaches and a sore throat ("felt weird"). Pertinent negatives include no rhinorrhea or sinus pain. Associated symptoms comments: Chills, fatigue, body aches. She has tried nothing for the symptoms.     Problems:  Patient Active Problem List   Diagnosis Date Noted   Nausea 12/02/2019   Fatigue 11/18/2019   Fungal infection of skin 08/25/2019   Essential hypertension 02/13/2018   Iron deficiency anemia 08/08/2017   PCOS (polycystic ovarian syndrome) 12/30/2014   Prediabetes 12/08/2014   Preventative health care 12/08/2014   Obesity (BMI 30.0-34.9) 11/10/2014   Migraines 11/10/2014    Allergies: No Known Allergies Medications:  Current Outpatient Medications:    molnupiravir EUA 200 mg CAPS, Take 4 capsules (800 mg total) by mouth 2 (two) times daily for 5 days., Disp: 40 capsule, Rfl: 0   amLODipine (NORVASC) 10 MG tablet, Take 1 tablet (10 mg total) by mouth daily. For blood pressure., Disp: 90 tablet, Rfl: 3   ferrous sulfate 325 (65 FE) MG tablet, Take 325 mg by mouth 2 (two) times a week., Disp: , Rfl:    letrozole (FEMARA) 2.5 MG tablet, Take 1 tablet (2.5 mg total) by mouth daily. Take on days 3 to 7 following a spontaneous menses or progestin-induced bleed., Disp: 5 tablet, Rfl: 2   medroxyPROGESTERone (PROVERA) 10 MG tablet, Take 1 tablet (10 mg total) by mouth daily. Use for ten days as needed to induce bleeding., Disp: 10 tablet, Rfl: 6   metFORMIN (GLUCOPHAGE) 1000 MG tablet, Take 1/2 tablet by mouth daily for one week. Then increase to one tablet daily for one week.  Then one tablet twice a day., Disp: 60 tablet, Rfl: 5   ondansetron (ZOFRAN ODT) 4 MG disintegrating tablet, Take 1 tablet (4 mg total) by mouth every 8 (eight) hours as needed for nausea or vomiting., Disp: 15 tablet, Rfl: 0   rizatriptan (MAXALT) 5 MG tablet, Take 1 tablet by mouth at migraine onset. May repeat in 2 hours if needed. Do not exceed 2 tablets in 24 hours., Disp: 10 tablet, Rfl:  0  Observations/Objective: Patient is well-developed, well-nourished in no acute distress.  Resting comfortably at home.  Head is normocephalic, atraumatic.  No labored breathing. No adventitious lung sounds audibly heard through video call Speech is clear and coherent with logical content.  Patient is alert and oriented at baseline.   Assessment and Plan: 1. COVID-19 - molnupiravir EUA 200 mg CAPS; Take 4 capsules (800 mg total) by mouth 2 (two) times daily for 5 days.  Dispense: 40 capsule; Refill: 0 - MyChart COVID-19 home monitoring program; Future - Temperature monitoring; Future - Continue OTC symptomatic management of choice - Will send OTC vitamins and supplement information through AVS - Patient enrolled in MyChart symptom monitoring - Push fluids - Rest as needed -  Discussed return precautions and when to seek in-person evaluation, sent via AVS as well   Follow Up Instructions: I discussed the assessment and treatment plan with the patient. The patient was provided an opportunity to ask questions and all were answered. The patient agreed with the plan and demonstrated an understanding of the instructions.  A copy of instructions were sent to the patient via MyChart.  The patient was advised to call back or seek an in-person evaluation if the symptoms worsen or if the condition fails to improve as anticipated.  Time:  I spent 13 minutes with the patient via telehealth technology discussing the above problems/concerns.    Veronica Loveless, PA-C

## 2020-11-28 DIAGNOSIS — F4323 Adjustment disorder with mixed anxiety and depressed mood: Secondary | ICD-10-CM | POA: Diagnosis not present

## 2020-12-05 DIAGNOSIS — F4321 Adjustment disorder with depressed mood: Secondary | ICD-10-CM | POA: Diagnosis not present

## 2020-12-12 DIAGNOSIS — F4323 Adjustment disorder with mixed anxiety and depressed mood: Secondary | ICD-10-CM | POA: Diagnosis not present

## 2020-12-14 ENCOUNTER — Encounter: Payer: BC Managed Care – PPO | Admitting: Primary Care

## 2020-12-26 DIAGNOSIS — F4323 Adjustment disorder with mixed anxiety and depressed mood: Secondary | ICD-10-CM | POA: Diagnosis not present

## 2021-01-02 DIAGNOSIS — F4321 Adjustment disorder with depressed mood: Secondary | ICD-10-CM | POA: Diagnosis not present

## 2021-01-09 DIAGNOSIS — F4323 Adjustment disorder with mixed anxiety and depressed mood: Secondary | ICD-10-CM | POA: Diagnosis not present

## 2021-01-16 ENCOUNTER — Encounter: Payer: Self-pay | Admitting: Physician Assistant

## 2021-01-16 ENCOUNTER — Other Ambulatory Visit: Payer: Self-pay

## 2021-01-16 ENCOUNTER — Telehealth: Payer: BC Managed Care – PPO | Admitting: Physician Assistant

## 2021-01-16 ENCOUNTER — Ambulatory Visit
Admission: EM | Admit: 2021-01-16 | Discharge: 2021-01-16 | Disposition: A | Discharge status: Home / Self Care | Payer: BC Managed Care – PPO | Attending: Sports Medicine | Admitting: Sports Medicine

## 2021-01-16 DIAGNOSIS — R102 Pelvic and perineal pain: Secondary | ICD-10-CM | POA: Diagnosis not present

## 2021-01-16 DIAGNOSIS — Z8742 Personal history of other diseases of the female genital tract: Secondary | ICD-10-CM | POA: Diagnosis not present

## 2021-01-16 DIAGNOSIS — R1032 Left lower quadrant pain: Secondary | ICD-10-CM | POA: Diagnosis not present

## 2021-01-16 DIAGNOSIS — E282 Polycystic ovarian syndrome: Secondary | ICD-10-CM | POA: Diagnosis not present

## 2021-01-16 DIAGNOSIS — R1024 Suprapubic pain: Secondary | ICD-10-CM

## 2021-01-16 DIAGNOSIS — R109 Unspecified abdominal pain: Secondary | ICD-10-CM | POA: Insufficient documentation

## 2021-01-16 DIAGNOSIS — R10A2 Flank pain, left side: Secondary | ICD-10-CM

## 2021-01-16 HISTORY — DX: Essential (primary) hypertension: I10

## 2021-01-16 LAB — URINALYSIS, COMPLETE (UACMP) WITH MICROSCOPIC
Bilirubin Urine: NEGATIVE
Glucose, UA: NEGATIVE mg/dL
Ketones, ur: 15 mg/dL — AB
Leukocytes,Ua: NEGATIVE
Nitrite: NEGATIVE
Protein, ur: NEGATIVE mg/dL
Specific Gravity, Urine: >1.03 — ABNORMAL HIGH (ref 1.005–1.030)
pH: 5.5 (ref 5.0–8.0)

## 2021-01-16 LAB — PREGNANCY, URINE: Preg Test, Ur: NEGATIVE

## 2021-01-16 NOTE — Patient Instructions (Signed)
  Veronica Foster, thank you for joining Karrie Meres, PA-C for today's virtual visit.  While this provider is not your primary care provider (PCP), if your PCP is located in our provider database this encounter information will be shared with them immediately following your visit.  Consent: (Patient) Veronica Foster provided verbal consent for this virtual visit at the beginning of the encounter.  Current Medications:  Current Outpatient Medications:    amLODipine (NORVASC) 10 MG tablet, Take 1 tablet (10 mg total) by mouth daily. For blood pressure., Disp: 90 tablet, Rfl: 3   ferrous sulfate 325 (65 FE) MG tablet, Take 325 mg by mouth 2 (two) times a week., Disp: , Rfl:    letrozole (FEMARA) 2.5 MG tablet, Take 1 tablet (2.5 mg total) by mouth daily. Take on days 3 to 7 following a spontaneous menses or progestin-induced bleed., Disp: 5 tablet, Rfl: 2   medroxyPROGESTERone (PROVERA) 10 MG tablet, Take 1 tablet (10 mg total) by mouth daily. Use for ten days as needed to induce bleeding., Disp: 10 tablet, Rfl: 6   metFORMIN (GLUCOPHAGE) 1000 MG tablet, Take 1/2 tablet by mouth daily for one week. Then increase to one tablet daily for one week.  Then one tablet twice a day., Disp: 60 tablet, Rfl: 5   ondansetron (ZOFRAN ODT) 4 MG disintegrating tablet, Take 1 tablet (4 mg total) by mouth every 8 (eight) hours as needed for nausea or vomiting., Disp: 15 tablet, Rfl: 0   rizatriptan (MAXALT) 5 MG tablet, Take 1 tablet by mouth at migraine onset. May repeat in 2 hours if needed. Do not exceed 2 tablets in 24 hours., Disp: 10 tablet, Rfl: 0   Medications ordered in this encounter:  No orders of the defined types were placed in this encounter.    *If you need refills on other medications prior to your next appointment, please contact your pharmacy*  Follow-Up: Call back or seek an in-person evaluation if the symptoms worsen or if the condition fails to improve as anticipated.  Other  Instructions Please seek an in person evaluation to further investigate the cause of your symptoms   If you have been instructed to have an in-person evaluation today at a local Urgent Care facility, please use the link below. It will take you to a list of all of our available [facility], including address, phone number and hours of operation. Please do not delay care.  [facility]  If you or a family member do not have a primary care provider, use the link below to schedule a visit and establish care. When you choose a [place] primary care physician or advanced practice provider, you gain a long-term partner in health. Find a Primary Care Provider  Learn more about [facility]'s in-office and virtual care options: [facility] - Get Care Now

## 2021-01-16 NOTE — ED Triage Notes (Signed)
Pt reports LLQ pain that radiates to her left flank area and her lower back on the left side that started yesterday. Denies urinary issues. Denies n/v. Pain increases with movement, bending, and laughing. Hard to get dress today d/t pain. Vaginal spotting, dark red in color as well yesterday. Hx of irregular menstrual. Last menstrual 11/25/2020.   Hx of PTOS  Pt had virtual visit, advised to have evaluation for possible r/o for eutopic pregnancy. No recent pregnancy test. Sexually active.

## 2021-01-16 NOTE — ED Provider Notes (Signed)
[facility]    CSN: [id number] Arrival date & time: 01/16/21  1615      History   Chief Complaint Chief Complaint  Patient presents with   Abdominal Pain   Back Pain    HPI Veronica Foster is a 35 y.o. female.   Patient is a 35 year old female who presents for evaluation of left lower quadrant and left groin pain.  She is normally seen and [place], [place] for her primary care needs.  They were unavailable to see her today.  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  She reports left-sided lower abdominal and groin pain for about 24 hours.  She also reports a little bit of left-sided low back and flank pain.  It does not go down her left leg.  Her last menstrual period was July 22.  That says she has irregular periods and has PCOS.  She did yoga which she normally does 2 days ago and then she swam yesterday morning again which she normally does.  She does not recall any accidents trauma or falls.  No fever shakes chills.  No nausea vomiting or diarrhea.  She has no pain on urination, no increased frequency or urgency.  There is a question of maybe some hematuria but she does have some intermittent spotting which is normal for her with vaginal bleeding.  She has irregular menses from her PCOS and often has spotting.  She said her spotting is a little bit increased in terms of volume and color is more red appearing.  She does have an OB/GYN also and [facility].  She is sexually active with her husband, she does not have any vaginal discharge and she is not concerned about an STD.  She has had her gallbladder removed but she still has her appendix.  She also has a history of ovarian cysts.  She also has some suprapubic discomfort.  She was actually seen online with a virtual visit about 3 or 4 hours prior to arrival.  It was suggested that she seek out care to have advanced imaging.  Unfortunately we do not have that capability at this time of  the day, and also we do not have the capability to do blood work.  She denies chest pain or shortness of breath.  No other symptoms offered.   Past Medical History:  Diagnosis Date   Chicken pox    GERD (gastroesophageal reflux disease)    History of UTI    Hypertension    Migraine    Murmur    PCOS (polycystic ovarian syndrome) 12/30/2014    Patient Active Problem List   Diagnosis Date Noted   Nausea 12/02/2019   Fatigue 11/18/2019   Fungal infection of skin 08/25/2019   Essential hypertension 02/13/2018   Iron deficiency anemia 08/08/2017   PCOS (polycystic ovarian syndrome) 12/30/2014   Prediabetes 12/08/2014   Preventative health care 12/08/2014   Obesity (BMI 30.0-34.9) 11/10/2014   Migraines 11/10/2014    Past Surgical History:  Procedure Laterality Date   CHOLECYSTECTOMY OPEN  2014    OB History     Gravida  0   Para  0   Term  0   Preterm  0   AB  0   Living  0      SAB  0   IAB  0   Ectopic  0   Multiple  0   Live Births  0  Home Medications    Prior to Admission medications   Medication Sig Start Date End Date Taking? Authorizing Provider  amLODipine (NORVASC) 10 MG tablet Take 1 tablet (10 mg total) by mouth daily. For blood pressure. 08/25/19   Doreene Nest, NP  ferrous sulfate 325 (65 FE) MG tablet Take 325 mg by mouth 2 (two) times a week.    [provider]  letrozole (FEMARA) 2.5 MG tablet Take 1 tablet (2.5 mg total) by mouth daily. Take on days 3 to 7 following a spontaneous menses or progestin-induced bleed. 08/31/19   Anyanwu, Jethro Bastos, MD  medroxyPROGESTERone (PROVERA) 10 MG tablet Take 1 tablet (10 mg total) by mouth daily. Use for ten days as needed to induce bleeding. 08/31/19   Anyanwu, Jethro Bastos, MD  metFORMIN (GLUCOPHAGE) 1000 MG tablet Take 1/2 tablet by mouth daily for one week. Then increase to one tablet daily for one week.  Then one tablet twice a day. 08/26/19   Anyanwu, Jethro Bastos, MD   ondansetron (ZOFRAN ODT) 4 MG disintegrating tablet Take 1 tablet (4 mg total) by mouth every 8 (eight) hours as needed for nausea or vomiting. 12/02/19   Doreene Nest, NP  rizatriptan (MAXALT) 5 MG tablet Take 1 tablet by mouth at migraine onset. May repeat in 2 hours if needed. Do not exceed 2 tablets in 24 hours. 08/25/19   Doreene Nest, NP    Family History Family History  Problem Relation Age of Onset   Cancer Mother        colon   Stroke Mother    Hypertension Mother    Arthritis Paternal Aunt    Hypertension Maternal Grandmother    Cancer Maternal Grandfather        prostate/pancreatic   Hypertension Maternal Grandfather    Arthritis Paternal Grandmother    Hypertension Paternal Grandmother    Cancer Paternal Grandfather        prostate   Heart disease Paternal Grandfather    Stroke Paternal Grandfather    Hypertension Paternal Grandfather     Social History Social History   Tobacco Use   Smoking status: Never   Smokeless tobacco: Never  Vaping Use   Vaping Use: Never used  Substance Use Topics   Alcohol use: Yes    Comment: socially   Drug use: No     Allergies   Patient has no known allergies.   Review of Systems Review of Systems  Constitutional:  Positive for activity change. Negative for appetite change, chills, diaphoresis, fatigue and fever.  HENT:  Negative for congestion, ear pain, postnasal drip, rhinorrhea, sinus pressure, sinus pain, sneezing and sore throat.   Eyes:  Negative for pain.  Respiratory:  Negative for cough, chest tightness and shortness of breath.   Cardiovascular:  Negative for chest pain and palpitations.  Gastrointestinal:  Positive for abdominal pain. Negative for diarrhea, nausea and vomiting.  Genitourinary:  Positive for flank pain, pelvic pain and vaginal bleeding. Negative for dysuria, frequency, hematuria, urgency, vaginal discharge and vaginal pain.  Musculoskeletal:  Positive for back pain. Negative for  myalgias and neck pain.  Skin:  Negative for color change, pallor, rash and wound.  Neurological:  Negative for dizziness, light-headedness, numbness and headaches.  All other systems reviewed and are negative.   Physical Exam Triage Vital Signs ED Triage Vitals  Enc Vitals Group     BP 01/16/21 1624 139/84     Pulse Rate 01/16/21 1624 91  Resp 01/16/21 1624 20     Temp 01/16/21 1624 99.1 F (37.3 C)     Temp Source 01/16/21 1624 Oral     SpO2 01/16/21 1624 99 %     Weight 01/16/21 1626 210 lb (95.3 kg)     Height 01/16/21 1626 5\' 3"  (1.6 m)     Head Circumference --      Peak Flow --      Pain Score 01/16/21 1625 7     Pain Loc --      Pain Edu? --      Excl. in GC? --    No data found.  Updated Vital Signs BP 139/84 (BP Location: Right Arm)   Pulse 91   Temp 99.1 F (37.3 C) (Oral)   Resp 20   Ht 5\' 3"  (1.6 m)   Wt 95.3 kg   SpO2 99%   BMI 37.20 kg/m   Visual Acuity Right Eye Distance:   Left Eye Distance:   Bilateral Distance:    Right Eye Near:   Left Eye Near:    Bilateral Near:     Physical Exam Vitals and nursing note reviewed.  Constitutional:      General: She is not in acute distress.    Appearance: Normal appearance. She is well-developed. She is not ill-appearing, toxic-appearing or diaphoretic.  HENT:     Head: Normocephalic and atraumatic.     Nose: Nose normal.     Mouth/Throat:     Mouth: Mucous membranes are moist.  Eyes:     General: No scleral icterus.    Extraocular Movements: Extraocular movements intact.     Conjunctiva/sclera: Conjunctivae normal.     Pupils: Pupils are equal, round, and reactive to light.  Cardiovascular:     Rate and Rhythm: Normal rate and regular rhythm.     Pulses: Normal pulses.     Heart sounds: Normal heart sounds. No murmur heard.   No friction rub. No gallop.  Pulmonary:     Effort: Pulmonary effort is normal.     Breath sounds: Normal breath sounds. No stridor. No wheezing, rhonchi or rales.   Abdominal:     General: Abdomen is flat. Bowel sounds are normal.     Palpations: Abdomen is soft. There is no hepatomegaly, splenomegaly or mass.     Tenderness: There is abdominal tenderness in the suprapubic area and left lower quadrant. There is guarding. There is no right CVA tenderness, left CVA tenderness or rebound. Negative signs include Murphy's sign, Rovsing's sign, McBurney's sign, psoas sign and obturator sign.  Musculoskeletal:     Cervical back: Normal range of motion and neck supple.     Comments: She does have good range of motion of both lower extremities.  When I lay her supine on the examination table she has discomfort with terminal flexion mostly in the abdomen.  Good internal and external rotation.  Scour's test is negative.  There is no evidence of any intra-articular hip pathology on examination.  Skin:    General: Skin is warm and dry.     Capillary Refill: Capillary refill takes less than 2 seconds.     Coloration: Skin is not jaundiced.     Findings: No erythema or rash.  Neurological:     General: No focal deficit present.     Mental Status: She is alert and oriented to person, place, and time.     UC Treatments / Results  Labs (all labs ordered are listed, but  only abnormal results are displayed) Labs Reviewed  URINALYSIS, COMPLETE (UACMP) WITH MICROSCOPIC - Abnormal; Notable for the following components:      Result Value   APPearance HAZY (*)    Specific Gravity, Urine >1.030 (*)    Hgb urine dipstick TRACE (*)    Ketones, ur 15 (*)    Bacteria, UA FEW (*)    All other components within normal limits  PREGNANCY, URINE    EKG   Radiology No results found.  Procedures Procedures (including critical care time)  Medications Ordered in UC Medications - No data to display  Initial Impression / Assessment and Plan / UC Course  I have reviewed the triage vital signs and the nursing notes.  Pertinent labs & imaging results that were available  during my care of the patient were reviewed by me and considered in my medical decision making (see chart for details).  Clinical impression: 1.  Left lower abdominal pain 2.  Left groin pain 3.  Left flank pain 4.  History of polycystic ovarian syndrome 5.  History of ovarian cyst 6.  Suprapubic pain.  Treatment plan: 1.  The findings and treatment plan were discussed in detail with the patient.  Patient was in agreement. 2.  I did have a long discussion with her that I do not have blood work or ultrasound or CT available to me at the moment.  I recommended doing a UA which showed trace blood, small amount of ketones and a few bacteria but no evidence of UTI.  Specific gravity was greater than 1.030 indicating that she has not drinking enough water.  This was conveyed to the patient.  Also done a urine pregnancy test which was negative. 3.  I indicated to her that given her lower abdominal pain I cannot rule out an ectopic pregnancy versus an intra-abdominal process.  Although the urine beta-hCG was negative, the beta-hCG via blood work would be much more accurate.  She understood this. 4.  Certainly on the differential is diverticulitis but she does not report a history of that.  I think she probably needs to go to the ER and get advanced imaging to rule out diverticulitis and an intra-abdominal process.  She agreed to go to the ER. 5.  Educational handouts provided. 6.  She was discharged in stable condition and will go to the emergency room directly.    Final Clinical Impressions(s) / UC Diagnoses   Final diagnoses:  Left lower quadrant abdominal pain  Left groin pain  Left flank pain  PCOS (polycystic ovarian syndrome)  History of ovarian cyst  Suprapubic pain     Discharge Instructions      As we discussed, we have limitations in lab tests given the time of day.  Your urine did not show that you have a UTI.  There was trace blood so I doubt that you have a kidney stone.  Given  your spotting and vaginal bleeding I cannot rule out a female issue.  I do not have access to ultrasound. Also, given your lower abdominal pain I cannot rule out an ectopic pregnancy versus intra-abdominal process.  This would include but is not limited to diverticulitis given the left lower quadrant pain.  I do not have access to advanced imaging or to blood tests. Please see educational handouts. As we discussed, my recommendation is that you go to the emergency room setting to get advanced imaging and blood test.  Given the amount of discomfort that you are  and I would recommend that you not wait and you have a family member take you there.  Given that you do work for [employer], it may be more beneficial for you to go to [facility].  I will leave that to your discretion.     ED Prescriptions   None    PDMP not reviewed this encounter.   Delton See, MD 01/16/21 [id number]

## 2021-01-16 NOTE — ED Notes (Signed)
Pt ambulated to BR for clean-cath UA, pt educated on proper clean-cath specimen. Pt verbalized understanding.

## 2021-01-16 NOTE — Progress Notes (Signed)
[name], Veronica Foster are scheduled for a virtual visit with your provider today.    Just as we do with appointments in the office, we must obtain your consent to participate.  Your consent will be active for this visit and any virtual visit you may have with one of our providers in the next 365 days.    If you have a MyChart account, I can also send a copy of this consent to you electronically.  All virtual visits are billed to your insurance company just like a traditional visit in the office.  As this is a virtual visit, video technology does not allow for your provider to perform a traditional examination.  This may limit your provider's ability to fully assess your condition.  If your provider identifies any concerns that need to be evaluated in person or the need to arrange testing such as labs, EKG, etc, we will make arrangements to do so.    Although advances in technology are sophisticated, we cannot ensure that it will always work on either your end or our end.  If the connection with a video visit is poor, we may have to switch to a telephone visit.  With either a video or telephone visit, we are not always able to ensure that we have a secure connection.   I need to obtain your verbal consent now.   Are you willing to proceed with your visit today?   Veronica Foster has provided verbal consent on 01/16/2021 for a virtual visit (video or telephone).   Karrie Meres, [place] 01/16/2021  12:28 PM   Date:  01/16/2021   ID:  Veronica Foster, DOB [birth year], MRN [id number]  Patient Location: Home Provider Location: Home Office   Participants: Patient and Provider for Visit and Wrap up  Method of visit: Video  Location of Patient: Home Location of Provider: Home Office Consent was obtain for visit over the video. Services rendered by provider: Visit was performed via video  A video enabled telemedicine application was used and I verified that I am speaking with the correct person using two  identifiers.  PCP:  Doreene Nest, NP   Chief Complaint:  llq abd pain  History of Present Illness:    Veronica Foster is a 35 y.o. female with history as stated below. Presents video telehealth for an acute care visit  She is c/o llq abd pain that started yesterday. She states pain I sharp in nature. Pain is worse with walking, breathing, and certain movements. Pain radiates to the left lower back. States she swam yesterday and did yoga the day before. Initially thought this was a pulled muscle and tried ibuprofen which provided some relief. Denies NVD, fevers, urinary sxs, vaginal discharge. Currently rates pain 5/10.   Past Medical History:  Diagnosis Date   Chicken pox    GERD (gastroesophageal reflux disease)    History of UTI    Migraine    Murmur    PCOS (polycystic ovarian syndrome) 12/30/2014    No outpatient medications have been marked as taking for the 01/16/21 encounter (Video Visit) with Saranda Legrande S, PA-C.     Allergies:   Patient has no known allergies.   ROS See HPI for history of present illness.  Physical Exam Vitals and nursing note reviewed.  Constitutional:      General: She is not in acute distress.    Appearance: She is well-developed.  HENT:     Head: Normocephalic and atraumatic.  Eyes:  Conjunctiva/sclera: Conjunctivae normal.  Pulmonary:     Effort: Pulmonary effort is normal.  Musculoskeletal:        General: Normal range of motion.     Cervical back: Neck supple.  Neurological:     Mental Status: She is alert.             MDM: Pt with abd pain and vaginal bleeding. Recommended she be seen in person to r/o ectopic versus other emergent cause of sxs. She is agreeable to seek evaluation today.  There are no diagnoses linked to this encounter.   Time:   Today, I have spent 10 minutes with the patient with telehealth technology discussing the above problems, reviewing the chart, previous notes, medications and orders.     Tests Ordered: No orders of the defined types were placed in this encounter.   Medication Changes: No orders of the defined types were placed in this encounter.    Disposition:  Follow up  Signed, Karrie Meres, PA-C  01/16/2021 12:28 PM

## 2021-01-16 NOTE — Discharge Instructions (Addendum)
As we discussed, we have limitations in lab tests given the time of day.  Your urine did not show that you have a UTI.  There was trace blood so I doubt that you have a kidney stone.  Given your spotting and vaginal bleeding I cannot rule out a female issue.  I do not have access to ultrasound. Also, given your lower abdominal pain I cannot rule out an ectopic pregnancy versus intra-abdominal process.  This would include but is not limited to diverticulitis given the left lower quadrant pain.  I do not have access to advanced imaging or to blood tests. Please see educational handouts. As we discussed, my recommendation is that you go to the emergency room setting to get advanced imaging and blood test.  Given the amount of discomfort that you are and I would recommend that you not wait and you have a family member take you there.  Given that you do work for [employer], it may be more beneficial for you to go to [facility].  I will leave that to your discretion.

## 2021-01-17 DIAGNOSIS — M5459 Other low back pain: Secondary | ICD-10-CM | POA: Diagnosis not present

## 2021-01-17 DIAGNOSIS — N898 Other specified noninflammatory disorders of vagina: Secondary | ICD-10-CM | POA: Diagnosis not present

## 2021-01-17 DIAGNOSIS — R1032 Left lower quadrant pain: Secondary | ICD-10-CM | POA: Diagnosis not present

## 2021-01-17 DIAGNOSIS — R188 Other ascites: Secondary | ICD-10-CM | POA: Diagnosis not present

## 2021-01-17 DIAGNOSIS — M545 Low back pain, unspecified: Secondary | ICD-10-CM | POA: Diagnosis not present

## 2021-01-17 DIAGNOSIS — K760 Fatty (change of) liver, not elsewhere classified: Secondary | ICD-10-CM | POA: Diagnosis not present

## 2021-01-17 DIAGNOSIS — R1012 Left upper quadrant pain: Secondary | ICD-10-CM | POA: Diagnosis not present

## 2021-01-23 DIAGNOSIS — F4323 Adjustment disorder with mixed anxiety and depressed mood: Secondary | ICD-10-CM | POA: Diagnosis not present

## 2021-01-26 ENCOUNTER — Other Ambulatory Visit: Payer: Self-pay

## 2021-01-26 ENCOUNTER — Ambulatory Visit (INDEPENDENT_AMBULATORY_CARE_PROVIDER_SITE_OTHER): Payer: BC Managed Care – PPO | Admitting: Primary Care

## 2021-01-26 ENCOUNTER — Encounter: Payer: Self-pay | Admitting: Primary Care

## 2021-01-26 VITALS — BP 130/82 | HR 76 | Temp 98.4°F | Ht 63.0 in | Wt 223.0 lb

## 2021-01-26 DIAGNOSIS — G43001 Migraine without aura, not intractable, with status migrainosus: Secondary | ICD-10-CM

## 2021-01-26 DIAGNOSIS — Z0000 Encounter for general adult medical examination without abnormal findings: Secondary | ICD-10-CM | POA: Diagnosis not present

## 2021-01-26 DIAGNOSIS — E282 Polycystic ovarian syndrome: Secondary | ICD-10-CM

## 2021-01-26 DIAGNOSIS — D509 Iron deficiency anemia, unspecified: Secondary | ICD-10-CM

## 2021-01-26 DIAGNOSIS — R7303 Prediabetes: Secondary | ICD-10-CM

## 2021-01-26 DIAGNOSIS — G43009 Migraine without aura, not intractable, without status migrainosus: Secondary | ICD-10-CM

## 2021-01-26 DIAGNOSIS — I10 Essential (primary) hypertension: Secondary | ICD-10-CM

## 2021-01-26 LAB — HEMOGLOBIN A1C: Hgb A1c MFr Bld: 5.8 % (ref 4.6–6.5)

## 2021-01-26 LAB — LIPID PANEL
Cholesterol: 145 mg/dL (ref 0–200)
HDL: 52.5 mg/dL (ref >39.00)
LDL Cholesterol: 76 mg/dL (ref 0–99)
NonHDL: 92.43
Total CHOL/HDL Ratio: 3
Triglycerides: 80 mg/dL (ref 0.0–149.0)
VLDL: 16 mg/dL (ref 0.0–40.0)

## 2021-01-26 MED ORDER — AMLODIPINE BESYLATE 10 MG PO TABS: 10.0000 mg | ORAL_TABLET | Freq: Every day | ORAL | 3 refills | Status: AC

## 2021-01-26 MED ORDER — RIZATRIPTAN BENZOATE 5 MG PO TABS: ORAL_TABLET | ORAL | 0 refills | Status: DC

## 2021-01-26 NOTE — Assessment & Plan Note (Signed)
Compliant to oral iron three days weekly, continue same.

## 2021-01-26 NOTE — Assessment & Plan Note (Signed)
Repeat A1C pending.  Commended her on regular exercise. 

## 2021-01-26 NOTE — Assessment & Plan Note (Addendum)
Following with GYN and is managed metformin 1000 mg BID. Continue same.   Continue Femara 2.5 mg monthly for fertility.

## 2021-01-26 NOTE — Assessment & Plan Note (Signed)
Overall stable today, has been out of amlodipine for 2 weeks. Refills for amlodipine 10 mg provided.

## 2021-01-26 NOTE — Patient Instructions (Signed)
Stop by the lab prior to leaving today. I will notify you of your results once received.   It was a pleasure to see you today!  Preventive Care 21-36 Years Old, Female Preventive care refers to lifestyle choices and visits with your health care provider that can promote health and wellness. This includes: A yearly physical exam. This is also called an annual wellness visit. Regular dental and eye exams. Immunizations. Screening for certain conditions. Healthy lifestyle choices, such as: Eating a healthy diet. Getting regular exercise. Not using drugs or products that contain nicotine and tobacco. Limiting alcohol use. What can I expect for my preventive care visit? Physical exam Your health care provider may check your: Height and weight. These may be used to calculate your BMI (body mass index). BMI is a measurement that tells if you are at a healthy weight. Heart rate and blood pressure. Body temperature. Skin for abnormal spots. Counseling Your health care provider may ask you questions about your: Past medical problems. Family's medical history. Alcohol, tobacco, and drug use. Emotional well-being. Home life and relationship well-being. Sexual activity. Diet, exercise, and sleep habits. Work and work environment. Access to firearms. Method of birth control. Menstrual cycle. Pregnancy history. What immunizations do I need? Vaccines are usually given at various ages, according to a schedule. Your health care provider will recommend vaccines for you based on your age, medical history, and lifestyle or other factors, such as travel or where you work. What tests do I need? Blood tests Lipid and cholesterol levels. These may be checked every 5 years starting at age 20. Hepatitis C test. Hepatitis B test. Screening Diabetes screening. This is done by checking your blood sugar (glucose) after you have not eaten for a while (fasting). STD (sexually transmitted disease)  testing, if you are at risk. BRCA-related cancer screening. This may be done if you have a family history of breast, ovarian, tubal, or peritoneal cancers. Pelvic exam and Pap test. This may be done every 3 years starting at age 21. Starting at age 30, this may be done every 5 years if you have a Pap test in combination with an HPV test. Talk with your health care provider about your test results, treatment options, and if necessary, the need for more tests. Follow these instructions at home: Eating and drinking  Eat a healthy diet that includes fresh fruits and vegetables, whole grains, lean protein, and low-fat dairy products. Take vitamin and mineral supplements as recommended by your health care provider. Do not drink alcohol if: Your health care provider tells you not to drink. You are pregnant, may be pregnant, or are planning to become pregnant. If you drink alcohol: Limit how much you have to 0-1 drink a day. Be aware of how much alcohol is in your drink. In the [place] one drink equals one 12 oz bottle of beer (355 mL), one 5 oz glass of wine (148 mL), or one 1 oz glass of hard liquor (44 mL). Lifestyle Take daily care of your teeth and gums. Brush your teeth every morning and night with fluoride toothpaste. Floss one time each day. Stay active. Exercise for at least 30 minutes 5 or more days each week. Do not use any products that contain nicotine or tobacco, such as cigarettes, e-cigarettes, and chewing tobacco. If you need help quitting, ask your health care provider. Do not use drugs. If you are sexually active, practice safe sex. Use a condom or other form of protection to prevent STIs (  sexually transmitted infections). If you do not wish to become pregnant, use a form of birth control. If you plan to become pregnant, see your health care provider for a prepregnancy visit. Find healthy ways to cope with stress, such as: Meditation, yoga, or listening to  music. Journaling. Talking to a trusted person. Spending time with friends and family. Safety Always wear your seat belt while driving or riding in a vehicle. Do not drive: If you have been drinking alcohol. Do not ride with someone who has been drinking. When you are tired or distracted. While texting. Wear a helmet and other protective equipment during sports activities. If you have firearms in your house, make sure you follow all gun safety procedures. Seek help if you have been physically or sexually abused. What's next? Go to your health care provider once a year for an annual wellness visit. Ask your health care provider how often you should have your eyes and teeth checked. Stay up to date on all vaccines. This information is not intended to replace advice given to you by your health care provider. Make sure you discuss any questions you have with your health care provider. Document Revised: 07/21/2020 Document Reviewed: 01/22/2018 Elsevier Patient Education  2022 Elsevier Inc.  

## 2021-01-26 NOTE — Assessment & Plan Note (Signed)
No recent migraines, no recent use of rizatriptan 5 mg.  Continue PRN use. Refills provided.

## 2021-01-26 NOTE — Assessment & Plan Note (Signed)
Declines influenza vaccine. Other vaccines UTD. Pap smear UTD.  Discussed the importance of a healthy diet and regular exercise in order for weight loss, and to reduce the risk of further co-morbidity.  Exam today stable. Labs reviewed from [facility] ED visit and also pending.

## 2021-01-26 NOTE — Progress Notes (Signed)
Subjective:    Patient ID: Veronica Foster, female    DOB: [birth year], 35 y.o.   MRN: [id number]  HPI  Veronica Foster is a very pleasant 35 y.o. female who presents today for complete physical and follow up of chronic conditions.  Immunizations: -Tetanus: 2020 -Influenza: Declines  -Covid-19: 3 vaccines   Diet: Fair diet.  Exercise: Exercising 4-5 days weekly   Eye exam: Completes annually  Dental exam: Completes semi-annually   Pap Smear: Completed in 2020, follows with GYN.    Wt Readings from Last 3 Encounters:  01/26/21 223 lb (101.2 kg)  01/16/21 210 lb (95.3 kg)  12/02/19 211 lb 8 oz (95.9 kg)   BP Readings from Last 3 Encounters:  01/26/21 130/82  01/16/21 139/84  12/02/19 132/84      Review of Systems  Constitutional:  Negative for unexpected weight change.  HENT:  Negative for rhinorrhea.   Respiratory:  Negative for cough and shortness of breath.   Cardiovascular:  Negative for chest pain.  Gastrointestinal:  Negative for constipation and diarrhea.  Genitourinary:  Negative for difficulty urinating.       Chronic irregular menstrual cycles.   Musculoskeletal:  Negative for arthralgias and myalgias.  Skin:  Negative for rash.  Allergic/Immunologic: Negative for environmental allergies.  Neurological:  Negative for dizziness and headaches.  Psychiatric/Behavioral:  The patient is not nervous/anxious.         Past Medical History:  Diagnosis Date   Chicken pox    GERD (gastroesophageal reflux disease)    History of UTI    Hypertension    Migraine    Murmur    PCOS (polycystic ovarian syndrome) 12/30/2014    Social History   Socioeconomic History   Marital status: Single    Spouse name: Not on file   Number of children: Not on file   Years of education: Not on file   Highest education level: Not on file  Occupational History   Not on file  Tobacco Use   Smoking status: Never   Smokeless tobacco: Never  Vaping Use   Vaping Use: Never  used  Substance and Sexual Activity   Alcohol use: Yes    Comment: socially   Drug use: No   Sexual activity: Yes    Birth control/protection: None  Other Topics Concern   Not on file  Social History Narrative   Single.   Highest level of education is Masters.   Works for [employer] in [employer].   Enjoys traveling, reading, wine tasting.   Social Determinants of Health   Financial Resource Strain: Not on file  Food Insecurity: Not on file  Transportation Needs: Not on file  Physical Activity: Not on file  Stress: Not on file  Social Connections: Not on file  Intimate Partner Violence: Not on file    Past Surgical History:  Procedure Laterality Date   CHOLECYSTECTOMY OPEN  2014    Family History  Problem Relation Age of Onset   Cancer Mother        colon   Stroke Mother    Hypertension Mother    Arthritis Paternal Aunt    Hypertension Maternal Grandmother    Cancer Maternal Grandfather        prostate/pancreatic   Hypertension Maternal Grandfather    Arthritis Paternal Grandmother    Hypertension Paternal Grandmother    Cancer Paternal Grandfather        prostate   Heart disease Paternal Grandfather    Stroke  Paternal Grandfather    Hypertension Paternal Grandfather     No Known Allergies  Current Outpatient Medications on File Prior to Visit  Medication Sig Dispense Refill   amLODipine (NORVASC) 10 MG tablet Take 1 tablet (10 mg total) by mouth daily. For blood pressure. 90 tablet 3   ferrous sulfate 325 (65 FE) MG tablet Take 325 mg by mouth 2 (two) times a week.     letrozole (FEMARA) 2.5 MG tablet Take 1 tablet (2.5 mg total) by mouth daily. Take on days 3 to 7 following a spontaneous menses or progestin-induced bleed. 5 tablet 2   metFORMIN (GLUCOPHAGE) 1000 MG tablet Take 1/2 tablet by mouth daily for one week. Then increase to one tablet daily for one week.  Then one tablet twice a day. 60 tablet 5   ondansetron (ZOFRAN ODT) 4 MG  disintegrating tablet Take 1 tablet (4 mg total) by mouth every 8 (eight) hours as needed for nausea or vomiting. 15 tablet 0   rizatriptan (MAXALT) 5 MG tablet Take 1 tablet by mouth at migraine onset. May repeat in 2 hours if needed. Do not exceed 2 tablets in 24 hours. 10 tablet 0   No current facility-administered medications on file prior to visit.    BP 130/82   Pulse 76   Temp 98.4 F (36.9 C) (Temporal)   Ht 5\' 3"  (1.6 m)   Wt 223 lb (101.2 kg)   SpO2 94%   BMI 39.50 kg/m  Objective:   Physical Exam HENT:     Right Ear: Tympanic membrane and ear canal normal.     Left Ear: Tympanic membrane and ear canal normal.     Nose: Nose normal.  Eyes:     Conjunctiva/sclera: Conjunctivae normal.     Pupils: Pupils are equal, round, and reactive to light.  Neck:     Thyroid: No thyromegaly.  Cardiovascular:     Rate and Rhythm: Normal rate and regular rhythm.     Heart sounds: No murmur heard. Pulmonary:     Effort: Pulmonary effort is normal.     Breath sounds: Normal breath sounds. No rales.  Abdominal:     General: Bowel sounds are normal.     Palpations: Abdomen is soft.     Tenderness: There is no abdominal tenderness.  Musculoskeletal:        General: Normal range of motion.     Cervical back: Neck supple.  Lymphadenopathy:     Cervical: No cervical adenopathy.  Skin:    General: Skin is warm and dry.     Findings: No rash.  Neurological:     Mental Status: She is alert and oriented to person, place, and time.     Cranial Nerves: No cranial nerve deficit.     Deep Tendon Reflexes: Reflexes are normal and symmetric.  Psychiatric:        Mood and Affect: Mood normal.          Assessment & Plan:      This visit occurred during the SARS-CoV-2 public health emergency.  Safety protocols were in place, including screening questions prior to the visit, additional usage of staff PPE, and extensive cleaning of exam room while observing appropriate contact time as  indicated for disinfecting solutions.

## 2021-01-30 DIAGNOSIS — F4323 Adjustment disorder with mixed anxiety and depressed mood: Secondary | ICD-10-CM | POA: Diagnosis not present

## 2021-01-30 DIAGNOSIS — F4321 Adjustment disorder with depressed mood: Secondary | ICD-10-CM | POA: Diagnosis not present

## 2021-02-06 DIAGNOSIS — F4323 Adjustment disorder with mixed anxiety and depressed mood: Secondary | ICD-10-CM | POA: Diagnosis not present

## 2021-02-13 DIAGNOSIS — F4321 Adjustment disorder with depressed mood: Secondary | ICD-10-CM | POA: Diagnosis not present

## 2021-02-20 DIAGNOSIS — F4323 Adjustment disorder with mixed anxiety and depressed mood: Secondary | ICD-10-CM | POA: Diagnosis not present

## 2021-02-27 DIAGNOSIS — F4321 Adjustment disorder with depressed mood: Secondary | ICD-10-CM | POA: Diagnosis not present

## 2021-02-27 DIAGNOSIS — Z630 Problems in relationship with spouse or partner: Secondary | ICD-10-CM | POA: Diagnosis not present

## 2021-03-06 DIAGNOSIS — F4323 Adjustment disorder with mixed anxiety and depressed mood: Secondary | ICD-10-CM | POA: Diagnosis not present

## 2021-03-13 DIAGNOSIS — Z630 Problems in relationship with spouse or partner: Secondary | ICD-10-CM | POA: Diagnosis not present

## 2021-03-13 DIAGNOSIS — F4321 Adjustment disorder with depressed mood: Secondary | ICD-10-CM | POA: Diagnosis not present

## 2021-03-20 DIAGNOSIS — F4323 Adjustment disorder with mixed anxiety and depressed mood: Secondary | ICD-10-CM | POA: Diagnosis not present

## 2021-03-27 DIAGNOSIS — F4321 Adjustment disorder with depressed mood: Secondary | ICD-10-CM | POA: Diagnosis not present

## 2021-03-27 DIAGNOSIS — Z630 Problems in relationship with spouse or partner: Secondary | ICD-10-CM | POA: Diagnosis not present

## 2021-04-03 ENCOUNTER — Other Ambulatory Visit (HOSPITAL_COMMUNITY)
Admission: RE | Admit: 2021-04-03 | Discharge: 2021-04-03 | Disposition: A | Discharge status: Home / Self Care | Payer: BC Managed Care – PPO | Source: Ambulatory Visit | Attending: Obstetrics & Gynecology | Admitting: Obstetrics & Gynecology

## 2021-04-03 ENCOUNTER — Encounter: Payer: Self-pay | Admitting: Obstetrics & Gynecology

## 2021-04-03 ENCOUNTER — Other Ambulatory Visit: Payer: Self-pay

## 2021-04-03 ENCOUNTER — Ambulatory Visit (INDEPENDENT_AMBULATORY_CARE_PROVIDER_SITE_OTHER): Payer: BC Managed Care – PPO | Admitting: Obstetrics & Gynecology

## 2021-04-03 VITALS — BP 134/86 | HR 85 | Ht 62.0 in | Wt 217.0 lb

## 2021-04-03 DIAGNOSIS — Z8742 Personal history of other diseases of the female genital tract: Secondary | ICD-10-CM | POA: Insufficient documentation

## 2021-04-03 DIAGNOSIS — N926 Irregular menstruation, unspecified: Secondary | ICD-10-CM | POA: Diagnosis not present

## 2021-04-03 DIAGNOSIS — E282 Polycystic ovarian syndrome: Secondary | ICD-10-CM | POA: Diagnosis not present

## 2021-04-03 DIAGNOSIS — N760 Acute vaginitis: Secondary | ICD-10-CM

## 2021-04-03 DIAGNOSIS — B9689 Other specified bacterial agents as the cause of diseases classified elsewhere: Secondary | ICD-10-CM

## 2021-04-03 MED ORDER — NAPROXEN SODIUM 550 MG PO TABS: 550.0000 mg | ORAL_TABLET | Freq: Two times a day (BID) | ORAL | 4 refills | Status: DC

## 2021-04-03 NOTE — Progress Notes (Signed)
   GYNECOLOGY OFFICE VISIT NOTE  History:   Veronica Foster is a 35 y.o. G0P0000 here today for follow up for PCOS.  Currently on Metformin 1000 mg po bid for PCOS. Still reports irregular bleeding, periods last 10-14 days of the month, no heavy bleeding. Wants to know what she can do that will not be contraceptive; she is still trying to conceive and has seen [facility]I consult. Also, worried about possible vaginitis, wants evaluation.  She denies any pelvic pain or other concerns.    Past Medical History:  Diagnosis Date   Chicken pox    GERD (gastroesophageal reflux disease)    History of UTI    Hypertension    Migraine    Murmur    PCOS (polycystic ovarian syndrome) 12/30/2014    Past Surgical History:  Procedure Laterality Date   CHOLECYSTECTOMY OPEN  05/27/2012   WISDOM TOOTH EXTRACTION      The following portions of the patient's history were reviewed and updated as appropriate: allergies, current medications, past family history, past medical history, past social history, past surgical history and problem list.   Health Maintenance:  Normal pap and negative HRHPV on 07/14/2018.  Review of Systems:  Pertinent items noted in HPI and remainder of comprehensive ROS otherwise negative.  Physical Exam:  BP 134/86   Pulse 85   Ht 5\' 2"  (1.575 m)   Wt 217 lb (98.4 kg)   LMP 03/08/2021 (Exact Date)   BMI 39.69 kg/m  CONSTITUTIONAL: Well-developed, well-nourished female in no acute distress.  HEENT:  Normocephalic, atraumatic. External right and left ear normal. No scleral icterus.  NECK: Normal range of motion, supple, no masses noted on observation SKIN: No rash noted. Not diaphoretic. No erythema. No pallor. MUSCULOSKELETAL: Normal range of motion. No edema noted. NEUROLOGIC: Alert and oriented to person, place, and time. Normal muscle tone coordination. No cranial nerve deficit noted. PSYCHIATRIC: Normal mood and affect. Normal behavior. Normal judgment and thought  content. CARDIOVASCULAR: Normal heart rate noted RESPIRATORY: Effort and breath sounds normal, no problems with respiration noted ABDOMEN: No masses noted. No other overt distention noted.   PELVIC: Deferred     Assessment and Plan:     1. PCOS (polycystic ovarian syndrome) 2. Irregular menstrual bleeding Discussed that AUB due to PCOS is usually hormonally managed, but this can have contraceptive side effects. Discussed NSAIDs for now, can try Lysteda also to see if this may help. She will consider if she wants hormonal regulation if this does not help. Will continue fertility efforts with REI.  - naproxen sodium (ANAPROX DS) 550 MG tablet; Take 1 tablet (550 mg total) by mouth 2 (two) times daily with a meal.  Dispense: 30 tablet; Refill: 4  3. History of vaginitis Self swab done, will follow up results and manage accordingly. - Cervicovaginal ancillary only( [place])  Routine preventative health maintenance measures emphasized. Please refer to After Visit Summary for other counseling recommendations.   Return for any gynecologic concerns.    I spent 20 minutes dedicated to the care of this patient including pre-visit review of records, face to face time with the patient discussing her conditions and treatments and post visit orders.    03/10/2021, MD, FACOG Obstetrician & Gynecologist, [facility] for [facility]

## 2021-04-04 LAB — CERVICOVAGINAL ANCILLARY ONLY
Bacterial Vaginitis (gardnerella): POSITIVE — AB
Candida Glabrata: NEGATIVE
Candida Vaginitis: NEGATIVE
Comment: NEGATIVE
Comment: NEGATIVE
Comment: NEGATIVE

## 2021-04-04 MED ORDER — METRONIDAZOLE 500 MG PO TABS
500.0000 mg | ORAL_TABLET | Freq: Two times a day (BID) | ORAL | 0 refills | Status: AC
Start: 2021-04-04 — End: 2021-04-11

## 2021-04-04 NOTE — Addendum Note (Signed)
Addended by: Jaynie Collins A on: 04/04/2021 12:51 PM   Modules accepted: Orders

## 2021-04-10 DIAGNOSIS — F4321 Adjustment disorder with depressed mood: Secondary | ICD-10-CM | POA: Diagnosis not present

## 2021-04-10 DIAGNOSIS — Z630 Problems in relationship with spouse or partner: Secondary | ICD-10-CM | POA: Diagnosis not present

## 2021-04-24 DIAGNOSIS — F4323 Adjustment disorder with mixed anxiety and depressed mood: Secondary | ICD-10-CM | POA: Diagnosis not present

## 2021-04-24 DIAGNOSIS — Z630 Problems in relationship with spouse or partner: Secondary | ICD-10-CM | POA: Diagnosis not present

## 2021-05-01 DIAGNOSIS — F4323 Adjustment disorder with mixed anxiety and depressed mood: Secondary | ICD-10-CM | POA: Diagnosis not present

## 2021-05-08 DIAGNOSIS — Z630 Problems in relationship with spouse or partner: Secondary | ICD-10-CM | POA: Diagnosis not present

## 2021-05-08 DIAGNOSIS — F4321 Adjustment disorder with depressed mood: Secondary | ICD-10-CM | POA: Diagnosis not present

## 2021-05-17 ENCOUNTER — Telehealth: Payer: BC Managed Care – PPO | Admitting: Physician Assistant

## 2021-05-17 DIAGNOSIS — B9789 Other viral agents as the cause of diseases classified elsewhere: Secondary | ICD-10-CM | POA: Diagnosis not present

## 2021-05-17 DIAGNOSIS — J019 Acute sinusitis, unspecified: Secondary | ICD-10-CM

## 2021-05-17 MED ORDER — IPRATROPIUM BROMIDE 0.03 % NA SOLN: 2.0000 | Freq: Two times a day (BID) | NASAL | 0 refills | Status: DC

## 2021-05-17 NOTE — Progress Notes (Signed)

## 2021-05-22 DIAGNOSIS — Z630 Problems in relationship with spouse or partner: Secondary | ICD-10-CM | POA: Diagnosis not present

## 2021-05-22 DIAGNOSIS — F4321 Adjustment disorder with depressed mood: Secondary | ICD-10-CM | POA: Diagnosis not present

## 2021-05-31 DIAGNOSIS — F4323 Adjustment disorder with mixed anxiety and depressed mood: Secondary | ICD-10-CM | POA: Diagnosis not present

## 2021-06-07 ENCOUNTER — Telehealth (INDEPENDENT_AMBULATORY_CARE_PROVIDER_SITE_OTHER): Payer: BC Managed Care – PPO | Admitting: Primary Care

## 2021-06-07 ENCOUNTER — Other Ambulatory Visit: Payer: Self-pay

## 2021-06-07 ENCOUNTER — Encounter: Payer: Self-pay | Admitting: Primary Care

## 2021-06-07 DIAGNOSIS — R051 Acute cough: Secondary | ICD-10-CM

## 2021-06-07 NOTE — Progress Notes (Signed)
Patient ID: Veronica Foster, female    DOB: [birth year], 36 y.o.   MRN: [id number]  Virtual visit completed through [employer], a video enabled telemedicine application. Due to national recommendations of social distancing due to COVID-19, a virtual visit is felt to be most appropriate for this patient at this time. Reviewed limitations, risks, security and privacy concerns of performing a virtual visit and the availability of in person appointments. I also reviewed that there may be a patient responsible charge related to this service. The patient agreed to proceed.   Patient location: home Provider location: [place] at [facility], office Persons participating in this virtual visit: patient, provider   If any vitals were documented, they were collected by patient at home unless specified below.    Ht 5\' 2"  (1.575 m)    Wt 217 lb (98.4 kg)    BMI 39.69 kg/m    CC: Cough Subjective:   HPI: Veronica Foster is a 36 y.o. female with a history of migraines, hypertension, prediabetes, sinusitis presenting on 06/07/2021 to discuss cough.  Evaluated per [facility] E-Visit on 05/17/21 for a three day history of nasal congestion, facial pain, otalgia, sore throat. She had tried Tylenol, Sudafed PE, and saline nasal spray. She was diagnosed with acute viral sinusitis and treated with conservative measures.   Today she endorses not using the nasal spray that was prescribed as it caused nose bleeds. She used humidifiers, gargling warm water due to hoarse voice and sore throat. The majority of her symptoms have resolved except for a persistent cough which has been present for the last 10 days.   Overall she's feeling well except for a deep, dry, persistent cough. Worse at night and will wake gagging and vomiting. She's not taken anything for GERD, does have Prilosec at home. Cough is hardly present during the day.      Relevant past medical, surgical, family and social history reviewed and updated as  indicated. Interim medical history since our last visit reviewed. Allergies and medications reviewed and updated. Outpatient Medications Prior to Visit  Medication Sig Dispense Refill   amLODipine (NORVASC) 10 MG tablet Take 1 tablet (10 mg total) by mouth daily. For blood pressure. 90 tablet 3   ferrous sulfate 325 (65 FE) MG tablet Take 325 mg by mouth 2 (two) times a week.     letrozole (FEMARA) 2.5 MG tablet Take 1 tablet (2.5 mg total) by mouth daily. Take on days 3 to 7 following a spontaneous menses or progestin-induced bleed. 5 tablet 2   metFORMIN (GLUCOPHAGE) 1000 MG tablet Take 1/2 tablet by mouth daily for one week. Then increase to one tablet daily for one week.  Then one tablet twice a day. 60 tablet 5   ondansetron (ZOFRAN ODT) 4 MG disintegrating tablet Take 1 tablet (4 mg total) by mouth every 8 (eight) hours as needed for nausea or vomiting. 15 tablet 0   rizatriptan (MAXALT) 5 MG tablet Take 1 tablet by mouth at migraine onset. May repeat in 2 hours if needed. Do not exceed 2 tablets in 24 hours. 10 tablet 0   ipratropium (ATROVENT) 0.03 % nasal spray Place 2 sprays into both nostrils every 12 (twelve) hours. (Patient not taking: Reported on 06/07/2021) 30 mL 0   naproxen sodium (ANAPROX DS) 550 MG tablet Take 1 tablet (550 mg total) by mouth 2 (two) times daily with a meal. (Patient not taking: Reported on 06/07/2021) 30 tablet 4   No facility-administered medications prior to  visit.     Per HPI unless specifically indicated in ROS section below Review of Systems  Constitutional:  Negative for chills, fatigue and fever.  HENT:  Negative for congestion, postnasal drip and sinus pressure.   Respiratory:  Positive for cough. Negative for shortness of breath.   Cardiovascular:  Negative for chest pain.  Neurological:  Negative for headaches.  Objective:  Ht 5\' 2"  (1.575 m)    Wt 217 lb (98.4 kg)    BMI 39.69 kg/m   Wt Readings from Last 3 Encounters:  06/07/21 217 lb (98.4  kg)  04/03/21 217 lb (98.4 kg)  01/26/21 223 lb (101.2 kg)       Physical exam: General: Alert and oriented x 3, no distress, does not appear sickly  Pulmonary: Speaks in complete sentences without increased work of breathing, no cough during visit.  Psychiatric: Normal mood, thought content, and behavior.     Results for orders placed or performed in visit on 04/03/21  Cervicovaginal ancillary only( [place])  Result Value Ref Range   Bacterial Vaginitis (gardnerella) Positive (A)    Candida Vaginitis Negative    Candida Glabrata Negative    Comment      Normal Reference Range Bacterial Vaginosis - Negative   Comment Normal Reference Range Candida Species - Negative    Comment Normal Reference Range Candida Galbrata - Negative    Assessment & Plan:   Problem List Items Addressed This Visit       Other   Acute cough    Representative of reflux induced cough from viral illness.  Will have her start omeprazole 20 mg for acid reflux induced cough. Take 2 capsules at bedtime for 1-2 weeks, then reduce to 1 capsule at bedtime for 1-2 weeks then stop.  She will update via my chart if warranted.         No orders of the defined types were placed in this encounter.  No orders of the defined types were placed in this encounter.   I discussed the assessment and treatment plan with the patient. The patient was provided an opportunity to ask questions and all were answered. The patient agreed with the plan and demonstrated an understanding of the instructions. The patient was advised to call back or seek an in-person evaluation if the symptoms worsen or if the condition fails to improve as anticipated.  Follow up plan:  Start omeprazole 20 mg for acid reflux induced cough. Take 2 capsules at bedtime for 1-2 weeks, then reduce to 1 capsule at bedtime for 1-2 weeks then stop.  Reach out to my via MyChart with updates or questions!  It was a pleasure to see you  today!   13/08/22, NP

## 2021-06-07 NOTE — Patient Instructions (Signed)
Start omeprazole 20 mg for acid reflux induced cough. Take 2 capsules at bedtime for 1-2 weeks, then reduce to 1 capsule at bedtime for 1-2 weeks then stop.  Reach out to my via MyChart with updates or questions!  It was a pleasure to see you today!

## 2021-06-07 NOTE — Assessment & Plan Note (Signed)
Representative of reflux induced cough from viral illness.  Will have her start omeprazole 20 mg for acid reflux induced cough. Take 2 capsules at bedtime for 1-2 weeks, then reduce to 1 capsule at bedtime for 1-2 weeks then stop.  She will update via my chart if warranted.

## 2021-06-12 DIAGNOSIS — F4323 Adjustment disorder with mixed anxiety and depressed mood: Secondary | ICD-10-CM | POA: Diagnosis not present

## 2021-06-19 DIAGNOSIS — Z630 Problems in relationship with spouse or partner: Secondary | ICD-10-CM | POA: Diagnosis not present

## 2021-06-19 DIAGNOSIS — F4321 Adjustment disorder with depressed mood: Secondary | ICD-10-CM | POA: Diagnosis not present

## 2021-06-26 DIAGNOSIS — F4323 Adjustment disorder with mixed anxiety and depressed mood: Secondary | ICD-10-CM | POA: Diagnosis not present

## 2021-07-10 DIAGNOSIS — F4323 Adjustment disorder with mixed anxiety and depressed mood: Secondary | ICD-10-CM | POA: Diagnosis not present

## 2021-07-24 ENCOUNTER — Other Ambulatory Visit: Payer: Self-pay

## 2021-07-24 ENCOUNTER — Ambulatory Visit (INDEPENDENT_AMBULATORY_CARE_PROVIDER_SITE_OTHER): Payer: BC Managed Care – PPO | Admitting: *Deleted

## 2021-07-24 ENCOUNTER — Ambulatory Visit (INDEPENDENT_AMBULATORY_CARE_PROVIDER_SITE_OTHER): Payer: BC Managed Care – PPO

## 2021-07-24 VITALS — BP 146/89 | HR 105 | Wt 216.0 lb

## 2021-07-24 DIAGNOSIS — O3680X0 Pregnancy with inconclusive fetal viability, not applicable or unspecified: Secondary | ICD-10-CM

## 2021-07-24 NOTE — Progress Notes (Addendum)
New OB Intake  I explained I am completing New OB Intake today. Pt had a positive UPT yesterday, LMP was 05/15/21. I have reviewed her allergies, medications, Medical/Surgical/OB history, and appropriate screenings.   Patient Active Problem List   Diagnosis Date Noted   Essential hypertension 02/13/2018   Iron deficiency anemia 08/08/2017   PCOS (polycystic ovarian syndrome) 12/30/2014   Prediabetes 12/08/2014   Obesity (BMI 30.0-34.9) 11/10/2014   Migraines 11/10/2014    Concerns addressed today  Delivery Plans:  Plans to deliver at [facility]     Patient informed that the ultrasound is considered a limited obstetric ultrasound and is not intended to be a complete ultrasound exam.  Patient also informed that the ultrasound is not being completed with the intent of assessing for fetal or placental anomalies or any pelvic abnormalities. Explained that the purpose of today's ultrasound is to assess for dating and fetal heart rate.  Patient acknowledges the purpose of the exam and the limitations of the study.      First visit review Ultrasound was performed, no gestational sac seen. Consulted with Dr Macon Large, pt to get HCG to confirm pregnancy and will follow up after results come back.    Scheryl Marten, RN 07/24/2021  2:40 PM

## 2021-07-25 ENCOUNTER — Telehealth: Payer: Self-pay | Admitting: *Deleted

## 2021-07-25 LAB — BETA HCG QUANT (REF LAB): hCG Quant: 76 m[IU]/mL

## 2021-07-25 NOTE — Telephone Encounter (Signed)
-----   Message from Tereso Newcomer, MD sent at 07/25/2021  7:56 AM EST ----- ?Very early stage of pregnancy, around  3-[redacted] weeks gestation.  If no concerning symptoms, no need to recheck HCG.  No viability scan indicated until at least 3 weeks from now. Patient should continue to take prenatal vitamins. ?

## 2021-07-25 NOTE — Telephone Encounter (Signed)
Called pt to make sure she saw HCG results and to schedule her viability scan in about 3 weeks  ?

## 2021-07-26 DIAGNOSIS — F4323 Adjustment disorder with mixed anxiety and depressed mood: Secondary | ICD-10-CM | POA: Diagnosis not present

## 2021-07-29 ENCOUNTER — Encounter: Payer: Self-pay | Admitting: Obstetrics & Gynecology

## 2021-08-07 ENCOUNTER — Ambulatory Visit: Payer: BC Managed Care – PPO | Admitting: Obstetrics & Gynecology

## 2021-08-07 DIAGNOSIS — Z3201 Encounter for pregnancy test, result positive: Secondary | ICD-10-CM | POA: Diagnosis not present

## 2021-08-07 DIAGNOSIS — O3680X0 Pregnancy with inconclusive fetal viability, not applicable or unspecified: Secondary | ICD-10-CM | POA: Diagnosis not present

## 2021-08-07 DIAGNOSIS — Z3A12 12 weeks gestation of pregnancy: Secondary | ICD-10-CM | POA: Diagnosis not present

## 2021-08-08 ENCOUNTER — Encounter: Payer: Self-pay | Admitting: Obstetrics & Gynecology

## 2021-08-08 DIAGNOSIS — E282 Polycystic ovarian syndrome: Secondary | ICD-10-CM

## 2021-08-08 MED ORDER — METFORMIN HCL 1000 MG PO TABS: 1000.0000 mg | ORAL_TABLET | Freq: Two times a day (BID) | ORAL | 12 refills | Status: AC

## 2021-08-08 NOTE — Addendum Note (Signed)
Addended by: Jaynie Collins A on: 08/08/2021 11:53 AM ? ? Modules accepted: Orders ? ?

## 2021-08-14 DIAGNOSIS — F4323 Adjustment disorder with mixed anxiety and depressed mood: Secondary | ICD-10-CM | POA: Diagnosis not present

## 2021-08-14 DIAGNOSIS — Z630 Problems in relationship with spouse or partner: Secondary | ICD-10-CM | POA: Diagnosis not present

## 2021-08-15 ENCOUNTER — Ambulatory Visit: Payer: BC Managed Care – PPO

## 2021-08-15 DIAGNOSIS — O039 Complete or unspecified spontaneous abortion without complication: Secondary | ICD-10-CM | POA: Diagnosis not present

## 2021-08-16 DIAGNOSIS — F4323 Adjustment disorder with mixed anxiety and depressed mood: Secondary | ICD-10-CM | POA: Diagnosis not present

## 2021-08-21 ENCOUNTER — Other Ambulatory Visit: Payer: Self-pay | Admitting: Primary Care

## 2021-08-21 DIAGNOSIS — G43001 Migraine without aura, not intractable, with status migrainosus: Secondary | ICD-10-CM

## 2021-08-21 MED ORDER — RIZATRIPTAN BENZOATE 5 MG PO TABS: ORAL_TABLET | ORAL | 0 refills | Status: AC

## 2021-08-23 DIAGNOSIS — F4323 Adjustment disorder with mixed anxiety and depressed mood: Secondary | ICD-10-CM | POA: Diagnosis not present

## 2021-08-23 DIAGNOSIS — Z630 Problems in relationship with spouse or partner: Secondary | ICD-10-CM | POA: Diagnosis not present

## 2021-09-06 DIAGNOSIS — F4323 Adjustment disorder with mixed anxiety and depressed mood: Secondary | ICD-10-CM | POA: Diagnosis not present

## 2021-09-20 DIAGNOSIS — F4323 Adjustment disorder with mixed anxiety and depressed mood: Secondary | ICD-10-CM | POA: Diagnosis not present

## 2021-10-01 DIAGNOSIS — F4323 Adjustment disorder with mixed anxiety and depressed mood: Secondary | ICD-10-CM | POA: Diagnosis not present

## 2021-10-09 DIAGNOSIS — F4323 Adjustment disorder with mixed anxiety and depressed mood: Secondary | ICD-10-CM | POA: Diagnosis not present

## 2021-10-09 DIAGNOSIS — Z803 Family history of malignant neoplasm of breast: Secondary | ICD-10-CM | POA: Diagnosis not present

## 2021-10-09 DIAGNOSIS — Z630 Problems in relationship with spouse or partner: Secondary | ICD-10-CM | POA: Diagnosis not present

## 2021-10-09 DIAGNOSIS — Z01419 Encounter for gynecological examination (general) (routine) without abnormal findings: Secondary | ICD-10-CM | POA: Diagnosis not present

## 2021-10-09 DIAGNOSIS — N970 Female infertility associated with anovulation: Secondary | ICD-10-CM | POA: Diagnosis not present

## 2021-10-18 DIAGNOSIS — F4323 Adjustment disorder with mixed anxiety and depressed mood: Secondary | ICD-10-CM | POA: Diagnosis not present

## 2021-10-18 DIAGNOSIS — R87612 Low grade squamous intraepithelial lesion on cytologic smear of cervix (LGSIL): Secondary | ICD-10-CM | POA: Diagnosis not present

## 2021-10-18 DIAGNOSIS — Z124 Encounter for screening for malignant neoplasm of cervix: Secondary | ICD-10-CM | POA: Diagnosis not present

## 2021-10-24 DIAGNOSIS — N970 Female infertility associated with anovulation: Secondary | ICD-10-CM | POA: Diagnosis not present

## 2021-10-30 DIAGNOSIS — E282 Polycystic ovarian syndrome: Secondary | ICD-10-CM | POA: Diagnosis not present

## 2021-10-30 DIAGNOSIS — N979 Female infertility, unspecified: Secondary | ICD-10-CM | POA: Diagnosis not present

## 2021-10-30 DIAGNOSIS — F4323 Adjustment disorder with mixed anxiety and depressed mood: Secondary | ICD-10-CM | POA: Diagnosis not present

## 2021-10-30 DIAGNOSIS — Z630 Problems in relationship with spouse or partner: Secondary | ICD-10-CM | POA: Diagnosis not present

## 2021-11-15 DIAGNOSIS — F4323 Adjustment disorder with mixed anxiety and depressed mood: Secondary | ICD-10-CM | POA: Diagnosis not present

## 2021-11-20 DIAGNOSIS — Z630 Problems in relationship with spouse or partner: Secondary | ICD-10-CM | POA: Diagnosis not present

## 2021-11-20 DIAGNOSIS — F4323 Adjustment disorder with mixed anxiety and depressed mood: Secondary | ICD-10-CM | POA: Diagnosis not present

## 2021-11-29 DIAGNOSIS — F4323 Adjustment disorder with mixed anxiety and depressed mood: Secondary | ICD-10-CM | POA: Diagnosis not present

## 2021-12-04 DIAGNOSIS — F4323 Adjustment disorder with mixed anxiety and depressed mood: Secondary | ICD-10-CM | POA: Diagnosis not present

## 2021-12-10 ENCOUNTER — Encounter: Payer: Self-pay | Admitting: *Deleted

## 2021-12-13 DIAGNOSIS — F4323 Adjustment disorder with mixed anxiety and depressed mood: Secondary | ICD-10-CM | POA: Diagnosis not present

## 2021-12-25 DIAGNOSIS — N898 Other specified noninflammatory disorders of vagina: Secondary | ICD-10-CM | POA: Diagnosis not present

## 2021-12-27 DIAGNOSIS — F4323 Adjustment disorder with mixed anxiety and depressed mood: Secondary | ICD-10-CM | POA: Diagnosis not present

## 2022-01-01 DIAGNOSIS — Z630 Problems in relationship with spouse or partner: Secondary | ICD-10-CM | POA: Diagnosis not present

## 2022-01-01 DIAGNOSIS — F4323 Adjustment disorder with mixed anxiety and depressed mood: Secondary | ICD-10-CM | POA: Diagnosis not present

## 2022-01-07 DIAGNOSIS — Z9189 Other specified personal risk factors, not elsewhere classified: Secondary | ICD-10-CM | POA: Diagnosis not present

## 2022-01-07 DIAGNOSIS — Z803 Family history of malignant neoplasm of breast: Secondary | ICD-10-CM | POA: Diagnosis not present

## 2022-01-10 DIAGNOSIS — F4323 Adjustment disorder with mixed anxiety and depressed mood: Secondary | ICD-10-CM | POA: Diagnosis not present

## 2022-01-22 ENCOUNTER — Encounter: Payer: BC Managed Care – PPO | Admitting: Obstetrics & Gynecology

## 2022-01-22 DIAGNOSIS — Z3A12 12 weeks gestation of pregnancy: Secondary | ICD-10-CM | POA: Diagnosis not present

## 2022-01-22 DIAGNOSIS — O09511 Supervision of elderly primigravida, first trimester: Secondary | ICD-10-CM | POA: Diagnosis not present

## 2022-01-22 DIAGNOSIS — Z630 Problems in relationship with spouse or partner: Secondary | ICD-10-CM | POA: Diagnosis not present

## 2022-01-22 DIAGNOSIS — O09521 Supervision of elderly multigravida, first trimester: Secondary | ICD-10-CM | POA: Diagnosis not present

## 2022-01-22 DIAGNOSIS — O99212 Obesity complicating pregnancy, second trimester: Secondary | ICD-10-CM | POA: Diagnosis not present

## 2022-01-22 DIAGNOSIS — O10012 Pre-existing essential hypertension complicating pregnancy, second trimester: Secondary | ICD-10-CM | POA: Diagnosis not present

## 2022-01-22 DIAGNOSIS — E669 Obesity, unspecified: Secondary | ICD-10-CM | POA: Diagnosis not present

## 2022-01-22 DIAGNOSIS — F4323 Adjustment disorder with mixed anxiety and depressed mood: Secondary | ICD-10-CM | POA: Diagnosis not present

## 2022-01-23 DIAGNOSIS — Z369 Encounter for antenatal screening, unspecified: Secondary | ICD-10-CM | POA: Diagnosis not present

## 2022-01-24 DIAGNOSIS — F4323 Adjustment disorder with mixed anxiety and depressed mood: Secondary | ICD-10-CM | POA: Diagnosis not present

## 2022-01-24 DIAGNOSIS — Z9189 Other specified personal risk factors, not elsewhere classified: Secondary | ICD-10-CM | POA: Diagnosis not present

## 2022-01-24 DIAGNOSIS — Z1231 Encounter for screening mammogram for malignant neoplasm of breast: Secondary | ICD-10-CM | POA: Diagnosis not present

## 2022-02-05 DIAGNOSIS — Z713 Dietary counseling and surveillance: Secondary | ICD-10-CM | POA: Diagnosis not present

## 2022-02-05 DIAGNOSIS — E282 Polycystic ovarian syndrome: Secondary | ICD-10-CM | POA: Diagnosis not present

## 2022-02-05 DIAGNOSIS — O10912 Unspecified pre-existing hypertension complicating pregnancy, second trimester: Secondary | ICD-10-CM | POA: Diagnosis not present

## 2022-02-05 DIAGNOSIS — I10 Essential (primary) hypertension: Secondary | ICD-10-CM | POA: Diagnosis not present

## 2022-02-07 DIAGNOSIS — F4323 Adjustment disorder with mixed anxiety and depressed mood: Secondary | ICD-10-CM | POA: Diagnosis not present

## 2022-02-12 DIAGNOSIS — Z630 Problems in relationship with spouse or partner: Secondary | ICD-10-CM | POA: Diagnosis not present

## 2022-02-12 DIAGNOSIS — F4323 Adjustment disorder with mixed anxiety and depressed mood: Secondary | ICD-10-CM | POA: Diagnosis not present

## 2022-02-19 DIAGNOSIS — F4323 Adjustment disorder with mixed anxiety and depressed mood: Secondary | ICD-10-CM | POA: Diagnosis not present

## 2022-03-05 DIAGNOSIS — E669 Obesity, unspecified: Secondary | ICD-10-CM | POA: Diagnosis not present

## 2022-03-05 DIAGNOSIS — O09522 Supervision of elderly multigravida, second trimester: Secondary | ICD-10-CM | POA: Diagnosis not present

## 2022-03-05 DIAGNOSIS — Z9889 Other specified postprocedural states: Secondary | ICD-10-CM | POA: Diagnosis not present

## 2022-03-05 DIAGNOSIS — O99212 Obesity complicating pregnancy, second trimester: Secondary | ICD-10-CM | POA: Diagnosis not present

## 2022-03-06 DIAGNOSIS — F4323 Adjustment disorder with mixed anxiety and depressed mood: Secondary | ICD-10-CM | POA: Diagnosis not present

## 2022-03-12 DIAGNOSIS — F4323 Adjustment disorder with mixed anxiety and depressed mood: Secondary | ICD-10-CM | POA: Diagnosis not present

## 2022-03-15 DIAGNOSIS — E669 Obesity, unspecified: Secondary | ICD-10-CM | POA: Diagnosis not present

## 2022-03-15 DIAGNOSIS — O99212 Obesity complicating pregnancy, second trimester: Secondary | ICD-10-CM | POA: Diagnosis not present

## 2022-03-15 DIAGNOSIS — O3442 Maternal care for other abnormalities of cervix, second trimester: Secondary | ICD-10-CM | POA: Diagnosis not present

## 2022-03-15 DIAGNOSIS — O09522 Supervision of elderly multigravida, second trimester: Secondary | ICD-10-CM | POA: Diagnosis not present

## 2022-03-21 DIAGNOSIS — F4323 Adjustment disorder with mixed anxiety and depressed mood: Secondary | ICD-10-CM | POA: Diagnosis not present

## 2022-03-29 DIAGNOSIS — Z9889 Other specified postprocedural states: Secondary | ICD-10-CM | POA: Diagnosis not present

## 2022-03-29 DIAGNOSIS — E669 Obesity, unspecified: Secondary | ICD-10-CM | POA: Diagnosis not present

## 2022-03-29 DIAGNOSIS — O09522 Supervision of elderly multigravida, second trimester: Secondary | ICD-10-CM | POA: Diagnosis not present

## 2022-03-29 DIAGNOSIS — O99212 Obesity complicating pregnancy, second trimester: Secondary | ICD-10-CM | POA: Diagnosis not present

## 2022-04-01 DIAGNOSIS — F4323 Adjustment disorder with mixed anxiety and depressed mood: Secondary | ICD-10-CM | POA: Diagnosis not present

## 2022-04-01 DIAGNOSIS — Z630 Problems in relationship with spouse or partner: Secondary | ICD-10-CM | POA: Diagnosis not present

## 2022-04-01 DIAGNOSIS — F4321 Adjustment disorder with depressed mood: Secondary | ICD-10-CM | POA: Diagnosis not present

## 2022-04-10 DIAGNOSIS — Z3A23 23 weeks gestation of pregnancy: Secondary | ICD-10-CM | POA: Diagnosis not present

## 2022-04-10 DIAGNOSIS — Z041 Encounter for examination and observation following transport accident: Secondary | ICD-10-CM | POA: Diagnosis not present

## 2022-04-10 DIAGNOSIS — O9A212 Injury, poisoning and certain other consequences of external causes complicating pregnancy, second trimester: Secondary | ICD-10-CM | POA: Diagnosis not present

## 2022-04-23 DIAGNOSIS — F4323 Adjustment disorder with mixed anxiety and depressed mood: Secondary | ICD-10-CM | POA: Diagnosis not present

## 2022-05-02 DIAGNOSIS — F4323 Adjustment disorder with mixed anxiety and depressed mood: Secondary | ICD-10-CM | POA: Diagnosis not present

## 2022-05-14 DIAGNOSIS — Z630 Problems in relationship with spouse or partner: Secondary | ICD-10-CM | POA: Diagnosis not present

## 2022-05-14 DIAGNOSIS — Z369 Encounter for antenatal screening, unspecified: Secondary | ICD-10-CM | POA: Diagnosis not present

## 2022-05-14 DIAGNOSIS — F4323 Adjustment disorder with mixed anxiety and depressed mood: Secondary | ICD-10-CM | POA: Diagnosis not present

## 2022-05-14 DIAGNOSIS — Z3A28 28 weeks gestation of pregnancy: Secondary | ICD-10-CM | POA: Diagnosis not present

## 2022-05-14 DIAGNOSIS — Z9889 Other specified postprocedural states: Secondary | ICD-10-CM | POA: Diagnosis not present

## 2022-05-14 DIAGNOSIS — Z3403 Encounter for supervision of normal first pregnancy, third trimester: Secondary | ICD-10-CM | POA: Diagnosis not present

## 2022-05-14 DIAGNOSIS — Z23 Encounter for immunization: Secondary | ICD-10-CM | POA: Diagnosis not present

## 2022-05-14 DIAGNOSIS — O10919 Unspecified pre-existing hypertension complicating pregnancy, unspecified trimester: Secondary | ICD-10-CM | POA: Diagnosis not present

## 2022-05-15 DIAGNOSIS — F4323 Adjustment disorder with mixed anxiety and depressed mood: Secondary | ICD-10-CM | POA: Diagnosis not present

## 2022-05-30 DIAGNOSIS — F4323 Adjustment disorder with mixed anxiety and depressed mood: Secondary | ICD-10-CM | POA: Diagnosis not present

## 2022-06-04 DIAGNOSIS — F4321 Adjustment disorder with depressed mood: Secondary | ICD-10-CM | POA: Diagnosis not present

## 2022-06-11 DIAGNOSIS — O99213 Obesity complicating pregnancy, third trimester: Secondary | ICD-10-CM | POA: Diagnosis not present

## 2022-06-11 DIAGNOSIS — O10013 Pre-existing essential hypertension complicating pregnancy, third trimester: Secondary | ICD-10-CM | POA: Diagnosis not present

## 2022-06-11 DIAGNOSIS — O09523 Supervision of elderly multigravida, third trimester: Secondary | ICD-10-CM | POA: Diagnosis not present

## 2022-06-13 DIAGNOSIS — F4323 Adjustment disorder with mixed anxiety and depressed mood: Secondary | ICD-10-CM | POA: Diagnosis not present

## 2022-06-19 DIAGNOSIS — O10013 Pre-existing essential hypertension complicating pregnancy, third trimester: Secondary | ICD-10-CM | POA: Diagnosis not present

## 2022-06-19 DIAGNOSIS — Z3A33 33 weeks gestation of pregnancy: Secondary | ICD-10-CM | POA: Diagnosis not present

## 2022-06-25 DIAGNOSIS — F4320 Adjustment disorder, unspecified: Secondary | ICD-10-CM | POA: Diagnosis not present

## 2022-06-26 DIAGNOSIS — Z3A34 34 weeks gestation of pregnancy: Secondary | ICD-10-CM | POA: Diagnosis not present

## 2022-06-26 DIAGNOSIS — O10013 Pre-existing essential hypertension complicating pregnancy, third trimester: Secondary | ICD-10-CM | POA: Diagnosis not present

## 2022-06-27 DIAGNOSIS — F4323 Adjustment disorder with mixed anxiety and depressed mood: Secondary | ICD-10-CM | POA: Diagnosis not present

## 2022-07-03 DIAGNOSIS — O10013 Pre-existing essential hypertension complicating pregnancy, third trimester: Secondary | ICD-10-CM | POA: Diagnosis not present

## 2022-07-03 DIAGNOSIS — Z3A35 35 weeks gestation of pregnancy: Secondary | ICD-10-CM | POA: Diagnosis not present

## 2022-07-10 DIAGNOSIS — O09523 Supervision of elderly multigravida, third trimester: Secondary | ICD-10-CM | POA: Diagnosis not present

## 2022-07-10 DIAGNOSIS — O99213 Obesity complicating pregnancy, third trimester: Secondary | ICD-10-CM | POA: Diagnosis not present

## 2022-07-10 DIAGNOSIS — E669 Obesity, unspecified: Secondary | ICD-10-CM | POA: Diagnosis not present

## 2022-07-10 DIAGNOSIS — O10013 Pre-existing essential hypertension complicating pregnancy, third trimester: Secondary | ICD-10-CM | POA: Diagnosis not present

## 2022-07-11 DIAGNOSIS — F4323 Adjustment disorder with mixed anxiety and depressed mood: Secondary | ICD-10-CM | POA: Diagnosis not present

## 2022-07-17 DIAGNOSIS — O10919 Unspecified pre-existing hypertension complicating pregnancy, unspecified trimester: Secondary | ICD-10-CM | POA: Diagnosis not present

## 2022-07-17 DIAGNOSIS — Z3A37 37 weeks gestation of pregnancy: Secondary | ICD-10-CM | POA: Diagnosis not present

## 2022-07-17 DIAGNOSIS — O10013 Pre-existing essential hypertension complicating pregnancy, third trimester: Secondary | ICD-10-CM | POA: Diagnosis not present

## 2022-07-17 DIAGNOSIS — Z3403 Encounter for supervision of normal first pregnancy, third trimester: Secondary | ICD-10-CM | POA: Diagnosis not present

## 2022-07-17 DIAGNOSIS — O99213 Obesity complicating pregnancy, third trimester: Secondary | ICD-10-CM | POA: Diagnosis not present

## 2022-07-21 DIAGNOSIS — O99214 Obesity complicating childbirth: Secondary | ICD-10-CM | POA: Diagnosis not present

## 2022-07-21 DIAGNOSIS — Z23 Encounter for immunization: Secondary | ICD-10-CM | POA: Diagnosis not present

## 2022-07-21 DIAGNOSIS — Z79899 Other long term (current) drug therapy: Secondary | ICD-10-CM | POA: Diagnosis not present

## 2022-07-21 DIAGNOSIS — Z7982 Long term (current) use of aspirin: Secondary | ICD-10-CM | POA: Diagnosis not present

## 2022-07-21 DIAGNOSIS — Z91018 Allergy to other foods: Secondary | ICD-10-CM | POA: Diagnosis not present

## 2022-07-21 DIAGNOSIS — O411230 Chorioamnionitis, third trimester, not applicable or unspecified: Secondary | ICD-10-CM | POA: Diagnosis not present

## 2022-07-21 DIAGNOSIS — O99284 Endocrine, nutritional and metabolic diseases complicating childbirth: Secondary | ICD-10-CM | POA: Diagnosis not present

## 2022-07-21 DIAGNOSIS — O1002 Pre-existing essential hypertension complicating childbirth: Secondary | ICD-10-CM | POA: Diagnosis not present

## 2022-07-21 DIAGNOSIS — E282 Polycystic ovarian syndrome: Secondary | ICD-10-CM | POA: Diagnosis not present

## 2022-07-21 DIAGNOSIS — Z8249 Family history of ischemic heart disease and other diseases of the circulatory system: Secondary | ICD-10-CM | POA: Diagnosis not present

## 2022-07-21 DIAGNOSIS — Z9049 Acquired absence of other specified parts of digestive tract: Secondary | ICD-10-CM | POA: Diagnosis not present

## 2022-07-21 DIAGNOSIS — O10013 Pre-existing essential hypertension complicating pregnancy, third trimester: Secondary | ICD-10-CM | POA: Diagnosis not present

## 2022-07-21 DIAGNOSIS — Z2989 Encounter for other specified prophylactic measures: Secondary | ICD-10-CM | POA: Diagnosis not present

## 2022-07-21 DIAGNOSIS — Z3A38 38 weeks gestation of pregnancy: Secondary | ICD-10-CM | POA: Diagnosis not present

## 2022-07-21 DIAGNOSIS — O411430 Placentitis, third trimester, not applicable or unspecified: Secondary | ICD-10-CM | POA: Diagnosis not present

## 2022-07-21 DIAGNOSIS — O1092 Unspecified pre-existing hypertension complicating childbirth: Secondary | ICD-10-CM | POA: Diagnosis not present

## 2022-07-21 DIAGNOSIS — Z833 Family history of diabetes mellitus: Secondary | ICD-10-CM | POA: Diagnosis not present

## 2022-08-08 DIAGNOSIS — F4323 Adjustment disorder with mixed anxiety and depressed mood: Secondary | ICD-10-CM | POA: Diagnosis not present

## 2022-08-23 DIAGNOSIS — F4323 Adjustment disorder with mixed anxiety and depressed mood: Secondary | ICD-10-CM | POA: Diagnosis not present

## 2022-09-03 DIAGNOSIS — Z1332 Encounter for screening for maternal depression: Secondary | ICD-10-CM | POA: Diagnosis not present

## 2022-09-03 DIAGNOSIS — Z124 Encounter for screening for malignant neoplasm of cervix: Secondary | ICD-10-CM | POA: Diagnosis not present

## 2022-09-03 DIAGNOSIS — R87612 Low grade squamous intraepithelial lesion on cytologic smear of cervix (LGSIL): Secondary | ICD-10-CM | POA: Diagnosis not present

## 2022-09-12 DIAGNOSIS — F4323 Adjustment disorder with mixed anxiety and depressed mood: Secondary | ICD-10-CM | POA: Diagnosis not present

## 2022-09-12 DIAGNOSIS — Z124 Encounter for screening for malignant neoplasm of cervix: Secondary | ICD-10-CM | POA: Diagnosis not present

## 2022-09-12 DIAGNOSIS — R87610 Atypical squamous cells of undetermined significance on cytologic smear of cervix (ASC-US): Secondary | ICD-10-CM | POA: Diagnosis not present

## 2022-09-19 DIAGNOSIS — F4323 Adjustment disorder with mixed anxiety and depressed mood: Secondary | ICD-10-CM | POA: Diagnosis not present

## 2022-09-20 DIAGNOSIS — E282 Polycystic ovarian syndrome: Secondary | ICD-10-CM | POA: Diagnosis not present

## 2022-09-20 DIAGNOSIS — Z136 Encounter for screening for cardiovascular disorders: Secondary | ICD-10-CM | POA: Diagnosis not present

## 2022-09-20 DIAGNOSIS — I10 Essential (primary) hypertension: Secondary | ICD-10-CM | POA: Diagnosis not present

## 2022-09-20 DIAGNOSIS — Z0000 Encounter for general adult medical examination without abnormal findings: Secondary | ICD-10-CM | POA: Diagnosis not present

## 2022-09-25 DIAGNOSIS — Z9189 Other specified personal risk factors, not elsewhere classified: Secondary | ICD-10-CM | POA: Diagnosis not present

## 2022-09-25 DIAGNOSIS — Z803 Family history of malignant neoplasm of breast: Secondary | ICD-10-CM | POA: Diagnosis not present

## 2022-10-03 DIAGNOSIS — F4323 Adjustment disorder with mixed anxiety and depressed mood: Secondary | ICD-10-CM | POA: Diagnosis not present

## 2022-10-04 DIAGNOSIS — Z1389 Encounter for screening for other disorder: Secondary | ICD-10-CM | POA: Diagnosis not present

## 2022-10-17 DIAGNOSIS — F4323 Adjustment disorder with mixed anxiety and depressed mood: Secondary | ICD-10-CM | POA: Diagnosis not present

## 2022-10-29 DIAGNOSIS — F4323 Adjustment disorder with mixed anxiety and depressed mood: Secondary | ICD-10-CM | POA: Diagnosis not present

## 2022-11-13 DIAGNOSIS — Z1389 Encounter for screening for other disorder: Secondary | ICD-10-CM | POA: Diagnosis not present

## 2022-11-14 DIAGNOSIS — F4323 Adjustment disorder with mixed anxiety and depressed mood: Secondary | ICD-10-CM | POA: Diagnosis not present

## 2022-12-12 DIAGNOSIS — F4323 Adjustment disorder with mixed anxiety and depressed mood: Secondary | ICD-10-CM | POA: Diagnosis not present

## 2022-12-19 DIAGNOSIS — Z3201 Encounter for pregnancy test, result positive: Secondary | ICD-10-CM | POA: Diagnosis not present

## 2022-12-19 DIAGNOSIS — O208 Other hemorrhage in early pregnancy: Secondary | ICD-10-CM | POA: Diagnosis not present

## 2022-12-19 DIAGNOSIS — Z3A08 8 weeks gestation of pregnancy: Secondary | ICD-10-CM | POA: Diagnosis not present

## 2022-12-19 DIAGNOSIS — O09521 Supervision of elderly multigravida, first trimester: Secondary | ICD-10-CM | POA: Diagnosis not present

## 2022-12-26 DIAGNOSIS — F4323 Adjustment disorder with mixed anxiety and depressed mood: Secondary | ICD-10-CM | POA: Diagnosis not present

## 2023-01-08 DIAGNOSIS — Z369 Encounter for antenatal screening, unspecified: Secondary | ICD-10-CM | POA: Diagnosis not present

## 2023-01-10 DIAGNOSIS — O09891 Supervision of other high risk pregnancies, first trimester: Secondary | ICD-10-CM | POA: Diagnosis not present

## 2023-01-10 DIAGNOSIS — Z369 Encounter for antenatal screening, unspecified: Secondary | ICD-10-CM | POA: Diagnosis not present

## 2023-01-10 DIAGNOSIS — O10911 Unspecified pre-existing hypertension complicating pregnancy, first trimester: Secondary | ICD-10-CM | POA: Diagnosis not present

## 2023-01-10 DIAGNOSIS — O368310 Maternal care for abnormalities of the fetal heart rate or rhythm, first trimester, not applicable or unspecified: Secondary | ICD-10-CM | POA: Diagnosis not present

## 2023-01-10 DIAGNOSIS — Z3A11 11 weeks gestation of pregnancy: Secondary | ICD-10-CM | POA: Diagnosis not present

## 2023-01-10 DIAGNOSIS — O99810 Abnormal glucose complicating pregnancy: Secondary | ICD-10-CM | POA: Diagnosis not present

## 2023-01-16 DIAGNOSIS — O09521 Supervision of elderly multigravida, first trimester: Secondary | ICD-10-CM | POA: Diagnosis not present

## 2023-01-16 DIAGNOSIS — Z36 Encounter for antenatal screening for chromosomal anomalies: Secondary | ICD-10-CM | POA: Diagnosis not present

## 2023-01-16 DIAGNOSIS — Z3A12 12 weeks gestation of pregnancy: Secondary | ICD-10-CM | POA: Diagnosis not present

## 2023-01-16 DIAGNOSIS — O99211 Obesity complicating pregnancy, first trimester: Secondary | ICD-10-CM | POA: Diagnosis not present

## 2023-01-23 DIAGNOSIS — F4323 Adjustment disorder with mixed anxiety and depressed mood: Secondary | ICD-10-CM | POA: Diagnosis not present

## 2023-01-28 DIAGNOSIS — Z9189 Other specified personal risk factors, not elsewhere classified: Secondary | ICD-10-CM | POA: Diagnosis not present

## 2023-01-28 DIAGNOSIS — R590 Localized enlarged lymph nodes: Secondary | ICD-10-CM | POA: Diagnosis not present

## 2023-01-28 DIAGNOSIS — Z1231 Encounter for screening mammogram for malignant neoplasm of breast: Secondary | ICD-10-CM | POA: Diagnosis not present

## 2023-02-06 DIAGNOSIS — F4323 Adjustment disorder with mixed anxiety and depressed mood: Secondary | ICD-10-CM | POA: Diagnosis not present

## 2023-02-10 DIAGNOSIS — R928 Other abnormal and inconclusive findings on diagnostic imaging of breast: Secondary | ICD-10-CM | POA: Diagnosis not present

## 2023-02-10 DIAGNOSIS — N6489 Other specified disorders of breast: Secondary | ICD-10-CM | POA: Diagnosis not present

## 2023-02-11 DIAGNOSIS — Z3A16 16 weeks gestation of pregnancy: Secondary | ICD-10-CM | POA: Diagnosis not present

## 2023-02-11 DIAGNOSIS — O468X2 Other antepartum hemorrhage, second trimester: Secondary | ICD-10-CM | POA: Diagnosis not present

## 2023-03-06 DIAGNOSIS — F4323 Adjustment disorder with mixed anxiety and depressed mood: Secondary | ICD-10-CM | POA: Diagnosis not present

## 2023-03-10 DIAGNOSIS — Z803 Family history of malignant neoplasm of breast: Secondary | ICD-10-CM | POA: Diagnosis not present

## 2023-03-10 DIAGNOSIS — R928 Other abnormal and inconclusive findings on diagnostic imaging of breast: Secondary | ICD-10-CM | POA: Diagnosis not present

## 2023-03-10 DIAGNOSIS — M7989 Other specified soft tissue disorders: Secondary | ICD-10-CM | POA: Diagnosis not present

## 2023-03-10 DIAGNOSIS — Z9189 Other specified personal risk factors, not elsewhere classified: Secondary | ICD-10-CM | POA: Diagnosis not present

## 2023-03-13 DIAGNOSIS — Z3686 Encounter for antenatal screening for cervical length: Secondary | ICD-10-CM | POA: Diagnosis not present

## 2023-03-13 DIAGNOSIS — O35EXX1 Maternal care for other (suspected) fetal abnormality and damage, fetal genitourinary anomalies, fetus 1: Secondary | ICD-10-CM | POA: Diagnosis not present

## 2023-03-13 DIAGNOSIS — O09522 Supervision of elderly multigravida, second trimester: Secondary | ICD-10-CM | POA: Diagnosis not present

## 2023-03-13 DIAGNOSIS — O10012 Pre-existing essential hypertension complicating pregnancy, second trimester: Secondary | ICD-10-CM | POA: Diagnosis not present

## 2023-03-13 DIAGNOSIS — Z3A20 20 weeks gestation of pregnancy: Secondary | ICD-10-CM | POA: Diagnosis not present

## 2023-03-13 DIAGNOSIS — O99212 Obesity complicating pregnancy, second trimester: Secondary | ICD-10-CM | POA: Diagnosis not present

## 2023-03-21 DIAGNOSIS — Z9189 Other specified personal risk factors, not elsewhere classified: Secondary | ICD-10-CM | POA: Diagnosis not present

## 2023-03-21 DIAGNOSIS — M7989 Other specified soft tissue disorders: Secondary | ICD-10-CM | POA: Diagnosis not present

## 2023-03-21 DIAGNOSIS — Z803 Family history of malignant neoplasm of breast: Secondary | ICD-10-CM | POA: Diagnosis not present

## 2023-03-21 DIAGNOSIS — R928 Other abnormal and inconclusive findings on diagnostic imaging of breast: Secondary | ICD-10-CM | POA: Diagnosis not present

## 2023-03-21 DIAGNOSIS — N6489 Other specified disorders of breast: Secondary | ICD-10-CM | POA: Diagnosis not present

## 2023-03-27 DIAGNOSIS — F4323 Adjustment disorder with mixed anxiety and depressed mood: Secondary | ICD-10-CM | POA: Diagnosis not present

## 2023-04-01 DIAGNOSIS — R269 Unspecified abnormalities of gait and mobility: Secondary | ICD-10-CM | POA: Diagnosis not present

## 2023-04-01 DIAGNOSIS — R262 Difficulty in walking, not elsewhere classified: Secondary | ICD-10-CM | POA: Diagnosis not present

## 2023-04-01 DIAGNOSIS — R293 Abnormal posture: Secondary | ICD-10-CM | POA: Diagnosis not present

## 2023-04-01 DIAGNOSIS — M6281 Muscle weakness (generalized): Secondary | ICD-10-CM | POA: Diagnosis not present

## 2023-04-02 DIAGNOSIS — R928 Other abnormal and inconclusive findings on diagnostic imaging of breast: Secondary | ICD-10-CM | POA: Diagnosis not present

## 2023-04-02 DIAGNOSIS — N6489 Other specified disorders of breast: Secondary | ICD-10-CM | POA: Diagnosis not present

## 2023-04-02 DIAGNOSIS — M7989 Other specified soft tissue disorders: Secondary | ICD-10-CM | POA: Diagnosis not present

## 2023-04-03 DIAGNOSIS — O09522 Supervision of elderly multigravida, second trimester: Secondary | ICD-10-CM | POA: Diagnosis not present

## 2023-04-03 DIAGNOSIS — E669 Obesity, unspecified: Secondary | ICD-10-CM | POA: Diagnosis not present

## 2023-04-03 DIAGNOSIS — O99212 Obesity complicating pregnancy, second trimester: Secondary | ICD-10-CM | POA: Diagnosis not present

## 2023-04-03 DIAGNOSIS — O35EXX0 Maternal care for other (suspected) fetal abnormality and damage, fetal genitourinary anomalies, not applicable or unspecified: Secondary | ICD-10-CM | POA: Diagnosis not present

## 2023-04-07 DIAGNOSIS — M6281 Muscle weakness (generalized): Secondary | ICD-10-CM | POA: Diagnosis not present

## 2023-04-07 DIAGNOSIS — R269 Unspecified abnormalities of gait and mobility: Secondary | ICD-10-CM | POA: Diagnosis not present

## 2023-04-07 DIAGNOSIS — R293 Abnormal posture: Secondary | ICD-10-CM | POA: Diagnosis not present

## 2023-04-07 DIAGNOSIS — R262 Difficulty in walking, not elsewhere classified: Secondary | ICD-10-CM | POA: Diagnosis not present

## 2023-04-10 DIAGNOSIS — F4323 Adjustment disorder with mixed anxiety and depressed mood: Secondary | ICD-10-CM | POA: Diagnosis not present

## 2023-04-29 DIAGNOSIS — M6281 Muscle weakness (generalized): Secondary | ICD-10-CM | POA: Diagnosis not present

## 2023-04-29 DIAGNOSIS — R269 Unspecified abnormalities of gait and mobility: Secondary | ICD-10-CM | POA: Diagnosis not present

## 2023-04-29 DIAGNOSIS — R262 Difficulty in walking, not elsewhere classified: Secondary | ICD-10-CM | POA: Diagnosis not present

## 2023-04-29 DIAGNOSIS — R293 Abnormal posture: Secondary | ICD-10-CM | POA: Diagnosis not present

## 2023-05-01 DIAGNOSIS — M6281 Muscle weakness (generalized): Secondary | ICD-10-CM | POA: Diagnosis not present

## 2023-05-01 DIAGNOSIS — R293 Abnormal posture: Secondary | ICD-10-CM | POA: Diagnosis not present

## 2023-05-01 DIAGNOSIS — R262 Difficulty in walking, not elsewhere classified: Secondary | ICD-10-CM | POA: Diagnosis not present

## 2023-05-01 DIAGNOSIS — M5431 Sciatica, right side: Secondary | ICD-10-CM | POA: Diagnosis not present

## 2023-05-08 DIAGNOSIS — F4323 Adjustment disorder with mixed anxiety and depressed mood: Secondary | ICD-10-CM | POA: Diagnosis not present

## 2023-05-09 DIAGNOSIS — O99810 Abnormal glucose complicating pregnancy: Secondary | ICD-10-CM | POA: Diagnosis not present

## 2023-05-09 DIAGNOSIS — O0993 Supervision of high risk pregnancy, unspecified, third trimester: Secondary | ICD-10-CM | POA: Diagnosis not present

## 2023-05-09 DIAGNOSIS — O10913 Unspecified pre-existing hypertension complicating pregnancy, third trimester: Secondary | ICD-10-CM | POA: Diagnosis not present

## 2023-05-09 DIAGNOSIS — O09523 Supervision of elderly multigravida, third trimester: Secondary | ICD-10-CM | POA: Diagnosis not present

## 2023-05-09 DIAGNOSIS — Z23 Encounter for immunization: Secondary | ICD-10-CM | POA: Diagnosis not present

## 2023-05-19 DIAGNOSIS — R262 Difficulty in walking, not elsewhere classified: Secondary | ICD-10-CM | POA: Diagnosis not present

## 2023-05-19 DIAGNOSIS — R293 Abnormal posture: Secondary | ICD-10-CM | POA: Diagnosis not present

## 2023-05-19 DIAGNOSIS — M6281 Muscle weakness (generalized): Secondary | ICD-10-CM | POA: Diagnosis not present

## 2023-05-19 DIAGNOSIS — R269 Unspecified abnormalities of gait and mobility: Secondary | ICD-10-CM | POA: Diagnosis not present

## 2023-05-23 DIAGNOSIS — R262 Difficulty in walking, not elsewhere classified: Secondary | ICD-10-CM | POA: Diagnosis not present

## 2023-05-23 DIAGNOSIS — R293 Abnormal posture: Secondary | ICD-10-CM | POA: Diagnosis not present

## 2023-05-23 DIAGNOSIS — R269 Unspecified abnormalities of gait and mobility: Secondary | ICD-10-CM | POA: Diagnosis not present

## 2023-05-23 DIAGNOSIS — M6281 Muscle weakness (generalized): Secondary | ICD-10-CM | POA: Diagnosis not present

## 2023-05-27 DIAGNOSIS — R293 Abnormal posture: Secondary | ICD-10-CM | POA: Diagnosis not present

## 2023-05-27 DIAGNOSIS — R262 Difficulty in walking, not elsewhere classified: Secondary | ICD-10-CM | POA: Diagnosis not present

## 2023-05-27 DIAGNOSIS — M6281 Muscle weakness (generalized): Secondary | ICD-10-CM | POA: Diagnosis not present

## 2023-05-27 DIAGNOSIS — R269 Unspecified abnormalities of gait and mobility: Secondary | ICD-10-CM | POA: Diagnosis not present
